# Patient Record
Sex: Male | Born: 1980 | Hispanic: Yes | Marital: Married | Smoking: Never smoker
Health system: Southern US, Community
[De-identification: ages and names within clinical notes are randomized; demographics above are authoritative.]

## PROBLEM LIST (undated history)

## (undated) DIAGNOSIS — F419 Anxiety disorder, unspecified: Secondary | ICD-10-CM

## (undated) DIAGNOSIS — F32A Depression, unspecified: Secondary | ICD-10-CM

## (undated) HISTORY — PX: VASECTOMY: SHX75

---

## 2007-04-30 ENCOUNTER — Emergency Department: Payer: Self-pay | Admitting: Emergency Medicine

## 2019-06-22 ENCOUNTER — Ambulatory Visit: Payer: Self-pay | Attending: Internal Medicine

## 2019-06-22 ENCOUNTER — Ambulatory Visit: Payer: Self-pay

## 2019-06-22 DIAGNOSIS — Z23 Encounter for immunization: Secondary | ICD-10-CM

## 2019-06-22 NOTE — Progress Notes (Signed)
   Covid-19 Vaccination Clinic  Name:  Bion Todorov    MRN: [id number] DOB: Dec 15, 1980  06/22/2019  Mr. Sandon Yoho was observed post Covid-19 immunization for 15 minutes without incident. He was provided with Vaccine Information Sheet and instruction to access the V-Safe system.   Mr. Siddharth Babington was instructed to call 911 with any severe reactions post vaccine: Marland Kitchen Difficulty breathing  . Swelling of face and throat  . A fast heartbeat  . A bad rash all over body  . Dizziness and weakness   Immunizations Administered    Name Date Dose VIS Date Route   Pfizer COVID-19 Vaccine 06/22/2019  1:59 PM 0.3 mL 02/22/2019 Intramuscular   Manufacturer: [employer], [employer]   Lot: [id number]   NDC: [address]-2

## 2019-07-16 ENCOUNTER — Ambulatory Visit: Payer: Self-pay | Attending: Internal Medicine

## 2019-07-16 DIAGNOSIS — Z23 Encounter for immunization: Secondary | ICD-10-CM

## 2019-07-20 ENCOUNTER — Ambulatory Visit: Payer: Self-pay

## 2020-08-14 ENCOUNTER — Encounter: Payer: Self-pay | Admitting: Urology

## 2020-08-14 ENCOUNTER — Other Ambulatory Visit: Payer: Self-pay

## 2020-08-14 ENCOUNTER — Ambulatory Visit (INDEPENDENT_AMBULATORY_CARE_PROVIDER_SITE_OTHER): Payer: BC Managed Care – PPO | Admitting: Urology

## 2020-08-14 VITALS — BP 107/68 | HR 62 | Ht 71.0 in | Wt 178.0 lb

## 2020-08-14 DIAGNOSIS — Z3009 Encounter for other general counseling and advice on contraception: Secondary | ICD-10-CM

## 2020-08-14 NOTE — Patient Instructions (Signed)

## 2020-08-14 NOTE — Progress Notes (Signed)
08/14/2020 4:54 PM   [name] [birth year] [id number]  Referring provider: Wilford Corner, PA-C [address] [place],  [place] [address]  Chief Complaint  Patient presents with  . VAS Consult    HPI: Daryl Chandler is a 40y.o. male who presents for vasectomy counseling.  Marland Kitchen He and his fiance have 4 children and do not desire additional children . Denies prior history urologic problems including chronic scrotal pain, epididymitis or orchitis . No previous history inguinal/pelvic hernia . No history of bleeding or clotting disorders   PMH: No past medical history on file.  Surgical History: None  Home Medications:  Allergies as of 08/14/2020   No Known Allergies     Medication List    as of August 14, 2020  4:54 PM   You have not been prescribed any medications.     Allergies: No Known Allergies  Family History: No family history on file.  Social History:  reports that he has never smoked. He has never used smokeless tobacco. He reports current alcohol use. No history on file for drug use.   Physical Exam: BP 107/68   Pulse 62   Ht 5\' 11"  (1.803 m)   Wt 178 lb (80.7 kg)   BMI 24.83 kg/m   Constitutional:  Alert and oriented, No acute distress. HEENT: [place] AT, moist mucus membranes.  Trachea midline, no masses. Cardiovascular: No clubbing, cyanosis, or edema. Respiratory: Normal respiratory effort, no increased work of breathing. GI: Abdomen is soft, nontender, nondistended, no abdominal masses GU: Phallus without lesions, testes descended bilaterally without masses or tenderness, spermatic cord/epididymis palpably normal bilaterally.  Vasa palpable bilaterally Skin: No rashes, bruises or suspicious lesions. Neurologic: Grossly intact, no focal deficits, moving all 4 extremities. Psychiatric: Normal mood and affect.   Assessment & Plan:    1.  Undesired fertility . Desires to schedule vasectomy . We had a long discussion about  vasectomy. We specifically discussed the procedure, recovery and the risks, benefits and alternatives of vasectomy. I explained that the procedure entails removal of a segment of each vas deferens, each of which conducts sperm, and that the purpose of this procedure is to cause sterility (inability to produce children or cause pregnancy). Vasectomy is intended to be permanent and irreversible form of contraception. Options for fertility after vasectomy include vasectomy reversal, or sperm retrieval with in vitro fertilization. These options are not always successful, and they may be expensive. We discussed reversible forms of birth control such as condoms, IUD or diaphragms, as well as the option of freezing sperm in a sperm bank prior to the vasectomy procedure. We discussed the importance of avoiding strenuous exercise for four days after vasectomy, and the importance of refraining from any form of ejaculation for seven days after vasectomy. I explained that vasectomy does not produce immediate sterility so another form of contraceptive must be used until sterility is assured by having semen checked for sperm. Thus, a post vasectomy semen analysis is necessary to confirm sterility. Rarely, vasectomy must be repeated. We discussed the approximately 1 in 2,000 risk of pregnancy after vasectomy for men who have post-vasectomy semen analysis showing absent sperm or rare non-motile sperm. Typical side effects include a small amount of oozing blood, some discomfort and mild swelling in the area of incision.  Vasectomy does not affect sexual performance, function, please, sensation, interest, desire, satisfaction, penile erection, volume of semen or ejaculation. Other rare risks include allergy or adverse reaction to an anesthetic, testicular atrophy, hematoma,  infection/abscess, prolonged tenderness of the vas deferens, pain, swelling, painful nodule or scar (called sperm granuloma) or epididymtis. We discussed chronic  testicular pain syndrome. This has been reported to occur in as many as 1-2% of men and may be permanent. This can be treated with medication, small procedures or (rarely) surgery. . Valium 10 mg as a preprocedure anxiolytic sent to pharmacy and he was informed he would need a driver if utilizing this medication.    Riki Altes, MD  [facility] [address] [place], [place] [address] [phone]

## 2020-08-16 MED ORDER — DIAZEPAM 10 MG PO TABS: ORAL_TABLET | ORAL | 0 refills | Status: DC

## 2020-09-03 ENCOUNTER — Encounter: Payer: Self-pay | Admitting: Urology

## 2020-09-03 ENCOUNTER — Other Ambulatory Visit: Payer: Self-pay

## 2020-09-03 ENCOUNTER — Ambulatory Visit: Payer: BC Managed Care – PPO | Admitting: Urology

## 2020-09-03 VITALS — BP 133/71 | HR 66 | Ht 71.0 in | Wt 180.0 lb

## 2020-09-03 DIAGNOSIS — Z302 Encounter for sterilization: Secondary | ICD-10-CM

## 2020-09-03 MED ORDER — HYDROCODONE-ACETAMINOPHEN 5-325 MG PO TABS: 1.0000 | ORAL_TABLET | ORAL | 0 refills | Status: DC | PRN

## 2020-09-03 NOTE — Progress Notes (Signed)
Vasectomy Procedure Note  Indications: The patient is a 40 y.o. male who presents today for elective sterilization.  He has been consented for the procedure.  He is aware of the risks and benefits.  He had no additional questions.  He agrees to proceed.  He denies any other significant change since his last visit.  Pre-operative Diagnosis: Elective sterilization  Post-operative Diagnosis: Elective sterilization  Premedication: Valium 10 mg po  Surgeon: Lorin Picket C. Syreeta Figler, M.D  Description: The patient was prepped and draped in the standard fashion.  The right vas deferens was identified and brought superiorly to the anterior scrotal skin.  The skin and vas was then anesthetized utilizing 7 ml 1% lidocaine.  A small stab incision was made and spread with the vas dissector.  The vas was grasped utilizing the vas clamp and elevated out of the incision.  The vas was dissected free from surrounding tissue and vessels and an ~1 cm segment was excised.  The vas lumens were cauterized utilizing electrocautery.  The distal segment was buried in the surrounding sheath with a 3-0 chromic suture.  No significant bleeding was observed.  The vas ends were then dropped back into the hemiscrotum.  The skin was closed with hemostatic pressure.  An identical procedure was performed on the contralateral side.  Clean dry gauze was applied to the incision sites.  The patient tolerated the procedure well.  Complications:None  Recommendations: 1.  No lifting greater than 10 pounds or strenuousactivity for 1 week. 2.  Scrotal support for 1 week. 3.  Shower only for 1 week; may shower in the morning 4.  May resume intercourse in one week if no significant discomfort.  Continue alternate contraception for 12 weeks.  5.  Call for significant pain, swelling, redness, drainage or fever greater than 100.5. 6.  Rx hydrocodone/APAP 5/325 1-2 every 6 hours as needed for pain. 7.  Follow-up semen analysis in 12  weeks.   Irineo Axon, MD

## 2020-09-03 NOTE — Patient Instructions (Signed)

## 2020-09-27 ENCOUNTER — Emergency Department: Payer: BC Managed Care – PPO

## 2020-09-27 ENCOUNTER — Emergency Department
Admission: EM | Admit: 2020-09-27 | Discharge: 2020-09-27 | Disposition: A | Discharge status: Home / Self Care | Payer: BC Managed Care – PPO | Attending: Emergency Medicine | Admitting: Emergency Medicine

## 2020-09-27 ENCOUNTER — Other Ambulatory Visit: Payer: Self-pay

## 2020-09-27 ENCOUNTER — Encounter: Payer: Self-pay | Admitting: Emergency Medicine

## 2020-09-27 DIAGNOSIS — K0889 Other specified disorders of teeth and supporting structures: Secondary | ICD-10-CM

## 2020-09-27 DIAGNOSIS — Z79899 Other long term (current) drug therapy: Secondary | ICD-10-CM | POA: Insufficient documentation

## 2020-09-27 DIAGNOSIS — R1032 Left lower quadrant pain: Secondary | ICD-10-CM | POA: Diagnosis not present

## 2020-09-27 DIAGNOSIS — N5082 Scrotal pain: Secondary | ICD-10-CM

## 2020-09-27 DIAGNOSIS — N50812 Left testicular pain: Secondary | ICD-10-CM

## 2020-09-27 LAB — COMPREHENSIVE METABOLIC PANEL
ALT: 24 U/L (ref 0–44)
AST: 20 U/L (ref 15–41)
Albumin: 4.1 g/dL (ref 3.5–5.0)
Alkaline Phosphatase: 70 U/L (ref 38–126)
Anion gap: 4 — ABNORMAL LOW (ref 5–15)
BUN: 15 mg/dL (ref 6–20)
CO2: 25 mmol/L (ref 22–32)
Calcium: 8.6 mg/dL — ABNORMAL LOW (ref 8.9–10.3)
Chloride: 106 mmol/L (ref 98–111)
Creatinine, Ser: 0.82 mg/dL (ref 0.61–1.24)
GFR, Estimated: >60 mL/min (ref >60)
Glucose, Bld: 89 mg/dL (ref 70–99)
Potassium: 4 mmol/L (ref 3.5–5.1)
Sodium: 135 mmol/L (ref 135–145)
Total Bilirubin: 1.3 mg/dL — ABNORMAL HIGH (ref 0.3–1.2)
Total Protein: 6.5 g/dL (ref 6.5–8.1)

## 2020-09-27 LAB — CBC WITH DIFFERENTIAL/PLATELET
Abs Immature Granulocytes: 0.02 10*3/uL (ref 0.00–0.07)
Basophils Absolute: 0 10*3/uL (ref 0.0–0.1)
Basophils Relative: 0 %
Eosinophils Absolute: 0.1 10*3/uL (ref 0.0–0.5)
Eosinophils Relative: 1 %
HCT: 38.4 % — ABNORMAL LOW (ref 39.0–52.0)
Hemoglobin: 13.2 g/dL (ref 13.0–17.0)
Immature Granulocytes: 0 %
Lymphocytes Relative: 26 %
Lymphs Abs: 1.6 10*3/uL (ref 0.7–4.0)
MCH: 29.4 pg (ref 26.0–34.0)
MCHC: 34.4 g/dL (ref 30.0–36.0)
MCV: 85.5 fL (ref 80.0–100.0)
Monocytes Absolute: 0.7 10*3/uL (ref 0.1–1.0)
Monocytes Relative: 11 %
Neutro Abs: 3.9 10*3/uL (ref 1.7–7.7)
Neutrophils Relative %: 62 %
Platelets: 209 10*3/uL (ref 150–400)
RBC: 4.49 MIL/uL (ref 4.22–5.81)
RDW: 11.8 % (ref 11.5–15.5)
WBC: 6.3 10*3/uL (ref 4.0–10.5)
nRBC: 0 % (ref 0.0–0.2)

## 2020-09-27 LAB — URINALYSIS, COMPLETE (UACMP) WITH MICROSCOPIC
Bacteria, UA: NONE SEEN
Bilirubin Urine: NEGATIVE
Glucose, UA: NEGATIVE mg/dL
Hgb urine dipstick: NEGATIVE
Ketones, ur: 5 mg/dL — AB
Leukocytes,Ua: NEGATIVE
Nitrite: NEGATIVE
Protein, ur: NEGATIVE mg/dL
Specific Gravity, Urine: 1.016 (ref 1.005–1.030)
WBC, UA: NONE SEEN WBC/hpf (ref 0–5)
pH: 6 (ref 5.0–8.0)

## 2020-09-27 MED ORDER — ONDANSETRON HCL 4 MG/2ML IJ SOLN
4.0000 mg | Freq: Once | INTRAMUSCULAR | Status: AC
  Administered 2020-09-27: 4 mg via INTRAVENOUS
  Filled 2020-09-27: qty 2

## 2020-09-27 MED ORDER — IOHEXOL 350 MG/ML SOLN: 100.0000 mL | Freq: Once | INTRAVENOUS | Status: DC | PRN

## 2020-09-27 MED ORDER — IOHEXOL 350 MG/ML SOLN
100.0000 mL | Freq: Once | INTRAVENOUS | Status: AC | PRN
  Administered 2020-09-27: 100 mL via INTRAVENOUS

## 2020-09-27 MED ORDER — PENICILLIN V POTASSIUM 250 MG PO TABS
500.0000 mg | ORAL_TABLET | Freq: Once | ORAL | Status: AC
  Administered 2020-09-27: 500 mg via ORAL
  Filled 2020-09-27: qty 2

## 2020-09-27 MED ORDER — HYDROCODONE-ACETAMINOPHEN 5-325 MG PO TABS: 1.0000 | ORAL_TABLET | ORAL | 0 refills | Status: DC | PRN

## 2020-09-27 MED ORDER — MORPHINE SULFATE (PF) 4 MG/ML IV SOLN
4.0000 mg | Freq: Once | INTRAVENOUS | Status: AC
  Administered 2020-09-27: 4 mg via INTRAVENOUS
  Filled 2020-09-27: qty 1

## 2020-09-27 MED ORDER — PENICILLIN V POTASSIUM 500 MG PO TABS: 500.0000 mg | ORAL_TABLET | Freq: Four times a day (QID) | ORAL | 0 refills | Status: AC

## 2020-09-27 NOTE — ED Provider Notes (Addendum)
[facility] Emergency Department Provider Note   ____________________________________________   Event Date/Time   First MD Initiated Contact with Patient 09/27/20 1044     (approximate)  I have reviewed the triage vital signs and the nursing notes.   HISTORY  Chief Complaint Testicle Pain and Dental Pain    HPI Daryl Chandler is a 40 y.o. male patient reports dental pain on the left lower side in the back of the jaw.  Is been going on for day or 2 worse last night with a lot of swelling.  Swelling seemed to get better after he took some Motrin.  He also complains of left testicular pain.  He had a vasectomy 3 to 4 weeks ago and was doing well until began hurting last night he got up to get some Tylenol and felt a strong pain there the pain and also in the left lower quadrant of his abdomen to palpation also to percussion of the left lower quadrant of the abdomen.  The lump is in the area where the vas would be.  It is too tender for me to get a really good exam.  Both pains are moderately severe and achy.        History reviewed. No pertinent past medical history.  There are no problems to display for this patient.   Past Surgical History:  Procedure Laterality Date   VASECTOMY      Prior to Admission medications   Medication Sig Start Date End Date Taking? Authorizing Provider  HYDROcodone-acetaminophen (NORCO) 5-325 MG tablet Take 1 tablet by mouth every 4 (four) hours as needed for moderate pain. 09/27/20  Yes Arnaldo Natal, MD  penicillin v potassium (VEETID) 500 MG tablet Take 1 tablet (500 mg total) by mouth 4 (four) times daily. 09/27/20  Yes Arnaldo Natal, MD  diazepam (VALIUM) 10 MG tablet 1 tab po 30 min prior to procedure 08/16/20   Stoioff, Verna Czech, MD  HYDROcodone-acetaminophen (NORCO/VICODIN) 5-325 MG tablet Take 1 tablet by mouth every 4 (four) hours as needed for moderate pain. 09/03/20   Riki Altes, MD    Allergies Patient  has no known allergies.  History reviewed. No pertinent family history.  Social History Social History   Tobacco Use   Smoking status: Never   Smokeless tobacco: Never  Substance Use Topics   Alcohol use: Yes     Constitutional: No fever/chills Eyes: No visual changes. ENT: No sore throat. Cardiovascular: Denies chest pain. Respiratory: Denies shortness of breath. Gastrointestinal: No abdominal pain.  No nausea, no vomiting.  No diarrhea.  No constipation. Genitourinary: Negative for dysuria. Musculoskeletal: Negative for back pain. Skin: Negative for rash. Neurological: Negative for headaches, focal weakness   ____________________________________________   PHYSICAL EXAM:  VITAL SIGNS: ED Triage Vitals  Enc Vitals Group     BP 09/27/20 0945 123/78     Pulse Rate 09/27/20 0945 60     Resp 09/27/20 0945 18     Temp 09/27/20 0945 98.2 F (36.8 C)     Temp Source 09/27/20 0945 Oral     SpO2 09/27/20 0945 98 %     Weight 09/27/20 0946 179 lb 14.3 oz (81.6 kg)     Height 09/27/20 0946 5\' 11"  (1.803 m)     Head Circumference --      Peak Flow --      Pain Score 09/27/20 0945 4     Pain Loc --      Pain  Edu? --      Excl. in GC? --    Constitutional: Alert and oriented. Well appearing and in no acute distress. Eyes: Conjunctivae are normal.  Head: Atraumatic. Nose: No congestion/rhinnorhea. Mouth/Throat: Mucous membranes are moist.  Oropharynx non-erythematous.  There is tenderness on percussion of the rear most molar on the left and the gum immediately behind it toward the jaw joint is somewhat swollen and appears to be a very small ulcer on the area.  He may have been biting on that and then got infected possibly. Neck: No stridor.   Cardiovascular: Normal rate, regular rhythm. Grossly normal heart sounds.  Good peripheral circulation. Respiratory: Normal respiratory effort.  No retractions. Lungs CTAB. Gastrointestinal: Soft and nontender except for the left lower  quadrant as described to palpation and percussion. No distention. No abdominal bruits. No CVA tenderness. Genitourinary: There is no bruising or swelling on the scrotum but there is the fullness and sensation of the small about 1 cm possible lump in the mid vas deferens area on the left side. Musculoskeletal: No lower extremity tenderness nor edema.  Neurologic:  Normal speech and language. No gross focal neurologic deficits are appreciated.  Skin:  Skin is warm, dry and intact. No rash noted.   ____________________________________________   LABS (all labs ordered are listed, but only abnormal results are displayed)  Labs Reviewed  CBC WITH DIFFERENTIAL/PLATELET - Abnormal; Notable for the following components:      Result Value   HCT 38.4 (*)    All other components within normal limits  COMPREHENSIVE METABOLIC PANEL - Abnormal; Notable for the following components:   Calcium 8.6 (*)    Total Bilirubin 1.3 (*)    Anion gap 4 (*)    All other components within normal limits  URINALYSIS, COMPLETE (UACMP) WITH MICROSCOPIC - Abnormal; Notable for the following components:   Color, Urine YELLOW (*)    APPearance CLEAR (*)    Ketones, ur 5 (*)    All other components within normal limits   ____________________________________________  EKG   ____________________________________________  RADIOLOGY Jill Poling, personally viewed and evaluated these images (plain radiographs) as part of my medical decision making, as well as reviewing the written report by the radiologist.  ED MD interpretation: Ultrasound of scrotum read by radiology reviewed by me does not show any acute pathology there is a small bilateral hydrocele. CT read by radiology reviewed by me does not show any acute pathology either.  Official radiology report(s): CT ABDOMEN PELVIS W CONTRAST  Result Date: 09/27/2020 CLINICAL DATA:  Left lower quadrant pain EXAM: CT ABDOMEN AND PELVIS WITH CONTRAST TECHNIQUE:  Multidetector CT imaging of the abdomen and pelvis was performed using the standard protocol following bolus administration of intravenous contrast. CONTRAST:  OMNIPAQUE IOHEXOL 350 MG/ML SOLN COMPARISON:  04/30/2007 FINDINGS: Lower chest: No acute abnormality. Hepatobiliary: No focal liver abnormality is seen. No gallstones, gallbladder wall thickening, or biliary dilatation. Pancreas: Unremarkable. No pancreatic ductal dilatation or surrounding inflammatory changes. Spleen: Normal in size without focal abnormality. Adrenals/Urinary Tract: Adrenal glands are unremarkable. Kidneys are normal, without renal calculi, focal lesion, or hydronephrosis. Bladder is physiologically distended. Stomach/Bowel: Stomach incompletely distended, unremarkable. Small bowel is decompressed, with incomplete distal passage of the oral contrast material. Normal appendix. The colon is nondilated, unremarkable. Vascular/Lymphatic: No significant vascular findings are present. No enlarged abdominal or pelvic lymph nodes. Reproductive: Prostate is unremarkable. Other: No ascites.  No free air. Musculoskeletal: Small protrusion L5-S1. No fracture or  worrisome bone lesion. IMPRESSION: No acute findings. Electronically Signed   By: Corlis Leak M.D.   On: 09/27/2020 15:08   US SCROTUM W/DOPPLER  Result Date: 09/27/2020 CLINICAL DATA:  Left testicular pain. EXAM: SCROTAL ULTRASOUND DOPPLER ULTRASOUND OF THE TESTICLES TECHNIQUE: Complete ultrasound examination of the testicles, epididymis, and other scrotal structures was performed. Color and spectral Doppler ultrasound were also utilized to evaluate blood flow to the testicles. COMPARISON:  None. FINDINGS: Right testicle Measurements: 4.2 x 2.1 x 2.8 cm. No mass or microlithiasis visualized. Left testicle Measurements: 3.7 x 2.4 x 2.9 cm. No mass or microlithiasis visualized. Right epididymis:  Normal in size and appearance. Left epididymis:  Normal in size and appearance. Hydrocele:   Small bilateral hydrocele. Varicocele:  None visualized. Pulsed Doppler interrogation of both testes demonstrates normal low resistance arterial and venous waveforms bilaterally. IMPRESSION: Normal appearance of the testicles. Small bilateral hydrocele. Electronically Signed   By: Ted Mcalpine M.D.   On: 09/27/2020 11:26    ____________________________________________   PROCEDURES  Procedure(s) performed (including Critical Care):  Procedures   ____________________________________________   INITIAL IMPRESSION / ASSESSMENT AND PLAN / ED COURSE  Patient is having fairly severe left lower quadrant pain that is markedly worse with palpation and percussion.  I want to make sure he is not having any diverticulitis or abscess that may come from an infection tracking up after surgery or possibly even a kidney stone or something similar.  ----------------------------------------- 3:20 PM on 09/27/2020 ----------------------------------------- Patient reports pain medicine helped a lot.  I am now able to palpate a small pea-sized smooth firm lump near the vas.  I am quite sure if it is on the vas because it still somewhat tender. Discussed the patient with Dr. Gabrielle Dare who will contact Dr. Lonna Cobb to arrange follow-up.  He feels Motrin or Tylenol with ice and elevation and tight underwear should be enough.  This may be a small sperm granuloma or scar tissue.  Should not result in any major problems.  Since the patient was still very tender when he got here I will give him 2-3 hydrocodone if he needs them.   Additionally I will give him Pen-Vee K 500 mg 1 4 times a day for his tooth ache.  He can use the Tylenol or Motrin or hydrocodone as needed for them as well.  He has said he will follow-up with his dentist soon as possible.  He will also return here if it gets worse.      ____________________________________________   FINAL CLINICAL IMPRESSION(S) / ED DIAGNOSES  Final diagnoses:   Scrotal pain  Toothache     ED Discharge Orders          Ordered    HYDROcodone-acetaminophen (NORCO) 5-325 MG tablet  Every 4 hours PRN        09/27/20 1527    penicillin v potassium (VEETID) 500 MG tablet  4 times daily        09/27/20 1534             Note:  This document was prepared using Dragon voice recognition software and may include unintentional dictation errors.    Arnaldo Natal, MD 09/27/20 1526    Arnaldo Natal, MD 09/27/20 1536

## 2020-09-27 NOTE — ED Notes (Signed)
Pt has finished oral contrast, called CT to come get pt.

## 2020-09-27 NOTE — Discharge Instructions (Addendum)
Please ice and elevate the scrotum.  Do this 20 minutes an hour with a towel between you and the ice as tolerated.  You can use Motrin 4 of the over-the-counter pills 3 times a day with food or Tylenol for pain.  Use the tight supportive underwear like we talked about.  For severe pain I will give you some Vicodin 1 pill 4 times a day as needed.  I will not give you many of those.  Do not drive on them as the police will consider you an impaired driver.  Do not operate hazardous machinery either.  Please return for increasing pain fever or any other problems.  Call your doctor and schedule an appointment for follow-up.  Hopefully they will be able to see you tomorrow.  I discussed your case with Dr.Sninsky who will also forward your information to Dr. Lonna Cobb for follow-up.  Use the penicillin 1 pill 4 times a day to help treat the dental infection.  Follow-up with your dentist as planned.

## 2020-09-27 NOTE — ED Triage Notes (Signed)
Pt comes into the ED via POV c/o dental pain on the left lower side as well as testicular pain (left).  Pt states that he did have a vasectomy a couple weeks ago and has had no problems until last night when he got up to get tylenol and he felt a strong pain and now there is a lump present.  Pt states there est of the testicle is not swollen, but just uncomfortable.  Pt ambulatory to triage at this time.

## 2020-09-27 NOTE — ED Notes (Signed)
Pt in CT at this time.

## 2020-09-28 ENCOUNTER — Telehealth: Payer: Self-pay

## 2020-09-28 NOTE — Telephone Encounter (Signed)
Pt Daryl Chandler on admin voicemail requesting a call back, called pt back he states that he felt 2 lumps in the scrotum and "freaked out" he then went to ER. He also states that he was having severe scrotal pain at that time. He cannot recall if he noticed the lumps before or after resuming intercourse. He was given pain medication in the ER and states that pain is significantly better now. He has also been icing and wearing scrotal support. He states he would like a follow up to ensure there are no issues, pt offered next available however he states he cannot come in until Friday. Patient schedule accordingly and advised to call back for questions or concerns or should symptoms worsen.

## 2020-10-02 ENCOUNTER — Ambulatory Visit (INDEPENDENT_AMBULATORY_CARE_PROVIDER_SITE_OTHER): Payer: BC Managed Care – PPO | Admitting: Physician Assistant

## 2020-10-02 ENCOUNTER — Encounter: Payer: Self-pay | Admitting: Physician Assistant

## 2020-10-02 ENCOUNTER — Other Ambulatory Visit: Payer: Self-pay

## 2020-10-02 VITALS — BP 119/72 | HR 60 | Ht 72.0 in | Wt 179.0 lb

## 2020-10-02 DIAGNOSIS — N5089 Other specified disorders of the male genital organs: Secondary | ICD-10-CM

## 2020-10-02 NOTE — Patient Instructions (Signed)
Swelling in the scrotum may take some time to resolve, as the scrotum is located below your torso and fluid has to work against gravity to leave this tissue. To promote resolution of swelling and decrease discomfort, please follow the scrotal support instructions below.  Scrotal support instructions:  Wear compressive underwear, e.g. jockstrap or snug boxer briefs, to keep the scrotum supported and promote drainage of swelling. Elevate the scrotum when at rest. Tuck a rolled washcloth or towel underneath the scrotum to lift it up and promote drainage back into your abdomen. Use ice as needed for pain relief. Never apply ice to the scrotum for longer than 20 minutes at a time and always keep a layer of fabric between the ice and the skin.  

## 2020-10-02 NOTE — Progress Notes (Signed)
   10/02/2020 3:37 PM   Daryl Chandler 14-Feb-1981 [id number]  CC: Chief Complaint  Patient presents with   Testicle Pain   HPI: Daryl Chandler is a 40 y.o. male who underwent vasectomy with Dr. Lonna Cobb on 09/03/2020 who presents today for evaluation of a left scrotal nodule.   He was seen in the emergency department 5 days ago for evaluation of left scrotal pain associated with the nodule as above as well as tooth ache.  He was administered Norco and penicillin V and discharged when his pain significantly improved.  Today he reports his severe pain started after a day of intense physical labor when he was not wearing supportive underwear.  He has been taking ibuprofen and Tylenol around-the-clock since being seen in the emergency department as well as using ice and wearing supportive underwear and is feeling significantly better.  He thinks that the nodule has shrunk in size over the course of this time.  PMH: No past medical history on file.  Surgical History: Past Surgical History:  Procedure Laterality Date   VASECTOMY      Home Medications:  Allergies as of 10/02/2020   No Known Allergies      Medication List        Accurate as of October 02, 2020  3:37 PM. If you have any questions, ask your nurse or doctor.          STOP taking these medications    HYDROcodone-acetaminophen 5-325 MG tablet Commonly known as: Norco Stopped by: Carman Ching, PA-C       TAKE these medications    diazepam 10 MG tablet Commonly known as: VALIUM 1 tab po 30 min prior to procedure   penicillin v potassium 500 MG tablet Commonly known as: VEETID Take 1 tablet (500 mg total) by mouth 4 (four) times daily.        Allergies:  No Known Allergies  Family History: No family history on file.  Social History:   reports that he has never smoked. He has never used smokeless tobacco. He reports current alcohol use. No history on file for drug use.  Physical  Exam: BP 119/72   Pulse (!) 0   Ht 6' (1.829 m)   Wt 179 lb (81.2 kg)   BMI 24.28 kg/m   Constitutional:  Alert and oriented, no acute distress, nontoxic appearing HEENT: [place], AT Cardiovascular: No clubbing, cyanosis, or edema Respiratory: Normal respiratory effort, no increased work of breathing GU: Pea-sized nodule within the left hemiscrotum superior to the testis, mildly tender with palpation.  No fluctuance or purulence. 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  Counseled him to continue scrotal support and explained that this should resolve on its own over time.  He expressed understanding  Return if symptoms worsen or fail to improve.  Carman Ching, PA-C  [facility] [address], [address] [place], [place] [address] [phone]

## 2020-11-02 ENCOUNTER — Other Ambulatory Visit: Payer: Self-pay

## 2020-11-02 DIAGNOSIS — Z302 Encounter for sterilization: Secondary | ICD-10-CM

## 2020-12-07 ENCOUNTER — Other Ambulatory Visit: Payer: Self-pay

## 2020-12-07 ENCOUNTER — Other Ambulatory Visit: Payer: BC Managed Care – PPO

## 2020-12-07 DIAGNOSIS — Z302 Encounter for sterilization: Secondary | ICD-10-CM

## 2020-12-08 ENCOUNTER — Telehealth: Payer: Self-pay | Admitting: *Deleted

## 2020-12-08 LAB — POST-VAS SPERM EVALUATION,QUAL: Volume: 1.8 mL

## 2020-12-08 NOTE — Telephone Encounter (Signed)
-----   Message from Riki Altes, MD sent at 12/08/2020  4:05 PM EDT ----- Semen analysis showed no sperm present.  Okay to use vasectomy as primary contraception.

## 2020-12-08 NOTE — Telephone Encounter (Signed)
Notified patient as instructed, patient pleased. Discussed follow-up appointments, patient agrees  

## 2021-12-27 ENCOUNTER — Ambulatory Visit (HOSPITAL_COMMUNITY): Payer: BC Managed Care – PPO | Admitting: Psychiatry

## 2021-12-27 ENCOUNTER — Encounter (HOSPITAL_COMMUNITY): Payer: Self-pay | Admitting: Psychiatry

## 2021-12-27 VITALS — BP 105/75 | HR 70 | Temp 98.3°F | Ht 71.0 in | Wt 179.0 lb

## 2021-12-27 DIAGNOSIS — F4310 Post-traumatic stress disorder, unspecified: Secondary | ICD-10-CM

## 2021-12-27 DIAGNOSIS — F411 Generalized anxiety disorder: Secondary | ICD-10-CM | POA: Diagnosis not present

## 2021-12-27 MED ORDER — DIVALPROEX SODIUM ER 500 MG PO TB24: 500.0000 mg | ORAL_TABLET | Freq: Every day | ORAL | 1 refills | Status: DC

## 2021-12-27 MED ORDER — HYDROXYZINE HCL 25 MG PO TABS: 25.0000 mg | ORAL_TABLET | Freq: Three times a day (TID) | ORAL | 1 refills | Status: DC | PRN
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
## 2022-01-31 ENCOUNTER — Ambulatory Visit (HOSPITAL_COMMUNITY): Payer: BC Managed Care – PPO | Admitting: Psychiatry

## 2022-01-31 ENCOUNTER — Encounter (HOSPITAL_COMMUNITY): Payer: Self-pay | Admitting: Psychiatry

## 2022-01-31 DIAGNOSIS — F4310 Post-traumatic stress disorder, unspecified: Secondary | ICD-10-CM | POA: Diagnosis not present

## 2022-01-31 DIAGNOSIS — F411 Generalized anxiety disorder: Secondary | ICD-10-CM | POA: Diagnosis not present

## 2022-01-31 MED ORDER — DIVALPROEX SODIUM ER 500 MG PO TB24: 500.0000 mg | ORAL_TABLET | Freq: Every day | ORAL | 2 refills | Status: DC

## 2022-01-31 MED ORDER — ESCITALOPRAM OXALATE 20 MG PO TABS: 20.0000 mg | ORAL_TABLET | Freq: Every day | ORAL | 1 refills | Status: DC
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
## 2022-03-01 ENCOUNTER — Telehealth (HOSPITAL_COMMUNITY): Payer: BC Managed Care – PPO | Admitting: Psychiatry

## 2022-03-02 ENCOUNTER — Telehealth (HOSPITAL_COMMUNITY): Payer: BC Managed Care – PPO | Admitting: Psychiatry

## 2022-04-19 ENCOUNTER — Encounter (HOSPITAL_COMMUNITY): Payer: BC Managed Care – PPO | Admitting: Psychiatry

## 2022-04-19 ENCOUNTER — Encounter (HOSPITAL_COMMUNITY): Payer: Self-pay

## 2022-04-19 NOTE — Progress Notes (Signed)
This encounter was created in error - please disregard.

## 2022-06-02 ENCOUNTER — Other Ambulatory Visit (HOSPITAL_COMMUNITY): Payer: Self-pay | Admitting: Psychiatry

## 2022-06-02 DIAGNOSIS — F4310 Post-traumatic stress disorder, unspecified: Secondary | ICD-10-CM

## 2022-06-02 DIAGNOSIS — F411 Generalized anxiety disorder: Secondary | ICD-10-CM

## 2022-06-02 NOTE — Telephone Encounter (Signed)
30 day script sent, needs an appointment for further refills

## 2022-06-24 ENCOUNTER — Other Ambulatory Visit (HOSPITAL_COMMUNITY): Payer: Self-pay | Admitting: Psychiatry

## 2022-06-24 DIAGNOSIS — F4310 Post-traumatic stress disorder, unspecified: Secondary | ICD-10-CM

## 2022-06-24 DIAGNOSIS — F411 Generalized anxiety disorder: Secondary | ICD-10-CM

## 2022-06-30 ENCOUNTER — Other Ambulatory Visit (HOSPITAL_COMMUNITY): Payer: Self-pay | Admitting: Psychiatry

## 2022-06-30 DIAGNOSIS — F4310 Post-traumatic stress disorder, unspecified: Secondary | ICD-10-CM

## 2022-06-30 DIAGNOSIS — F411 Generalized anxiety disorder: Secondary | ICD-10-CM

## 2022-07-01 ENCOUNTER — Telehealth (HOSPITAL_COMMUNITY): Payer: Self-pay | Admitting: *Deleted

## 2022-07-01 NOTE — Telephone Encounter (Signed)
Pt LVM requesting refills on all meds. Writer returned call but had to leave pt a VM advising that pt will need to make an appointment, as he has not been seen since 02/01/23, No Showed last appointment 04/19/22 and cx previous two appointments. Pt encouraged to call appointment line @ [phone] prior to 1200 as we close at 1200 on Fridays. Pt advised we would then review sending bridge scripts.

## 2022-07-04 ENCOUNTER — Telehealth (HOSPITAL_COMMUNITY): Payer: Self-pay | Admitting: *Deleted

## 2022-07-04 DIAGNOSIS — F4310 Post-traumatic stress disorder, unspecified: Secondary | ICD-10-CM

## 2022-07-04 DIAGNOSIS — F411 Generalized anxiety disorder: Secondary | ICD-10-CM

## 2022-07-04 MED ORDER — DIVALPROEX SODIUM ER 500 MG PO TB24: 500.0000 mg | ORAL_TABLET | Freq: Every day | ORAL | 0 refills | Status: DC

## 2022-07-04 MED ORDER — HYDROXYZINE HCL 25 MG PO TABS: 25.0000 mg | ORAL_TABLET | Freq: Three times a day (TID) | ORAL | 0 refills | Status: DC | PRN

## 2022-07-04 NOTE — Telephone Encounter (Signed)
[facility] Rx's for Depakote  ER 500 mg Qd and Hydroxyzine 25 mg tabs sent to [facility] [address] [place]. Pt should have enough Lexapro 20 mg QD until appointment. Pt has an appointment scheduled for 07/18/22.

## 2022-07-18 ENCOUNTER — Encounter (HOSPITAL_COMMUNITY): Payer: Self-pay | Admitting: Psychiatry

## 2022-07-18 ENCOUNTER — Ambulatory Visit (HOSPITAL_BASED_OUTPATIENT_CLINIC_OR_DEPARTMENT_OTHER): Payer: Self-pay | Admitting: Psychiatry

## 2022-07-18 DIAGNOSIS — F411 Generalized anxiety disorder: Secondary | ICD-10-CM

## 2022-07-18 DIAGNOSIS — F4310 Post-traumatic stress disorder, unspecified: Secondary | ICD-10-CM

## 2022-07-18 MED ORDER — ESCITALOPRAM OXALATE 20 MG PO TABS: 20.0000 mg | ORAL_TABLET | Freq: Every day | ORAL | 0 refills | Status: DC

## 2022-07-18 MED ORDER — DIVALPROEX SODIUM ER 500 MG PO TB24: 500.0000 mg | ORAL_TABLET | Freq: Two times a day (BID) | ORAL | 1 refills | Status: DC

## 2022-07-18 MED ORDER — HYDROXYZINE HCL 10 MG PO TABS: 10.0000 mg | ORAL_TABLET | Freq: Three times a day (TID) | ORAL | 1 refills | Status: DC | PRN
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
## 2022-08-10 ENCOUNTER — Other Ambulatory Visit (HOSPITAL_COMMUNITY): Payer: Self-pay | Admitting: Psychiatry

## 2022-08-10 DIAGNOSIS — F4310 Post-traumatic stress disorder, unspecified: Secondary | ICD-10-CM

## 2022-08-10 DIAGNOSIS — F411 Generalized anxiety disorder: Secondary | ICD-10-CM

## 2022-09-05 ENCOUNTER — Encounter (HOSPITAL_COMMUNITY): Payer: Self-pay

## 2022-09-05 ENCOUNTER — Encounter (HOSPITAL_COMMUNITY): Payer: BC Managed Care – PPO | Admitting: Psychiatry

## 2022-09-05 NOTE — Progress Notes (Signed)
This encounter was created in error - please disregard.
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
## 2022-09-11 ENCOUNTER — Other Ambulatory Visit (HOSPITAL_COMMUNITY): Payer: Self-pay | Admitting: Psychiatry

## 2022-09-11 DIAGNOSIS — F411 Generalized anxiety disorder: Secondary | ICD-10-CM

## 2022-09-11 DIAGNOSIS — F4310 Post-traumatic stress disorder, unspecified: Secondary | ICD-10-CM

## 2022-09-15 ENCOUNTER — Other Ambulatory Visit (HOSPITAL_COMMUNITY): Payer: Self-pay | Admitting: Psychiatry

## 2022-09-15 DIAGNOSIS — F4310 Post-traumatic stress disorder, unspecified: Secondary | ICD-10-CM

## 2022-09-15 DIAGNOSIS — F411 Generalized anxiety disorder: Secondary | ICD-10-CM

## 2022-09-22 ENCOUNTER — Other Ambulatory Visit (HOSPITAL_COMMUNITY): Payer: Self-pay | Admitting: *Deleted

## 2022-09-22 DIAGNOSIS — F411 Generalized anxiety disorder: Secondary | ICD-10-CM

## 2022-09-22 DIAGNOSIS — F4310 Post-traumatic stress disorder, unspecified: Secondary | ICD-10-CM

## 2022-09-22 MED ORDER — HYDROXYZINE HCL 10 MG PO TABS
10.0000 mg | ORAL_TABLET | Freq: Three times a day (TID) | ORAL | 0 refills | Status: DC | PRN
Start: 2022-09-22 — End: 2023-01-17

## 2022-09-22 MED ORDER — DIVALPROEX SODIUM ER 500 MG PO TB24
500.0000 mg | ORAL_TABLET | Freq: Two times a day (BID) | ORAL | 0 refills | Status: DC
Start: 2022-09-22 — End: 2022-10-13

## 2022-10-11 ENCOUNTER — Other Ambulatory Visit (HOSPITAL_COMMUNITY): Payer: Self-pay | Admitting: Psychiatry

## 2022-10-11 DIAGNOSIS — F411 Generalized anxiety disorder: Secondary | ICD-10-CM

## 2022-10-11 DIAGNOSIS — F4310 Post-traumatic stress disorder, unspecified: Secondary | ICD-10-CM

## 2022-10-12 ENCOUNTER — Other Ambulatory Visit (HOSPITAL_COMMUNITY): Payer: Self-pay | Admitting: Psychiatry

## 2022-10-12 DIAGNOSIS — F411 Generalized anxiety disorder: Secondary | ICD-10-CM
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
## 2022-10-13 ENCOUNTER — Telehealth (HOSPITAL_BASED_OUTPATIENT_CLINIC_OR_DEPARTMENT_OTHER): Payer: Self-pay | Admitting: Psychiatry

## 2022-10-13 ENCOUNTER — Encounter (HOSPITAL_COMMUNITY): Payer: Self-pay | Admitting: Psychiatry

## 2022-10-13 DIAGNOSIS — Z79899 Other long term (current) drug therapy: Secondary | ICD-10-CM

## 2022-10-13 DIAGNOSIS — F411 Generalized anxiety disorder: Secondary | ICD-10-CM

## 2022-10-13 DIAGNOSIS — F4310 Post-traumatic stress disorder, unspecified: Secondary | ICD-10-CM

## 2022-10-13 MED ORDER — DULOXETINE HCL 30 MG PO CPEP
ORAL_CAPSULE | ORAL | 2 refills | Status: DC
Start: 2022-10-13 — End: 2022-12-22

## 2022-10-13 MED ORDER — ESCITALOPRAM OXALATE 10 MG PO TABS: 10.0000 mg | ORAL_TABLET | Freq: Every day | ORAL | 0 refills | Status: DC

## 2022-10-13 MED ORDER — DIVALPROEX SODIUM ER 500 MG PO TB24
500.0000 mg | ORAL_TABLET | Freq: Two times a day (BID) | ORAL | 2 refills | Status: DC
Start: 2022-10-13 — End: 2022-12-22

## 2022-10-19 ENCOUNTER — Ambulatory Visit (HOSPITAL_BASED_OUTPATIENT_CLINIC_OR_DEPARTMENT_OTHER): Payer: Self-pay

## 2022-10-19 ENCOUNTER — Encounter (HOSPITAL_COMMUNITY): Payer: Self-pay

## 2022-10-19 ENCOUNTER — Other Ambulatory Visit (HOSPITAL_COMMUNITY): Payer: Self-pay | Admitting: Psychiatry

## 2022-10-19 DIAGNOSIS — Z79899 Other long term (current) drug therapy: Secondary | ICD-10-CM

## 2022-10-19 NOTE — Progress Notes (Signed)
Patient arrived this morning for his due labs - Depakote level. Patient was pleasant and cooperative. He states that he had a bit of a panic attack on the way here because of traffic, but was calmed down by the time he got here. Labs were drawn via venipuncture in the patients right AC, patient tolerated well and without complaint.
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
## 2022-12-22 ENCOUNTER — Telehealth (HOSPITAL_BASED_OUTPATIENT_CLINIC_OR_DEPARTMENT_OTHER): Payer: Self-pay | Admitting: Psychiatry

## 2022-12-22 DIAGNOSIS — F411 Generalized anxiety disorder: Secondary | ICD-10-CM

## 2022-12-22 DIAGNOSIS — Z79899 Other long term (current) drug therapy: Secondary | ICD-10-CM

## 2022-12-22 DIAGNOSIS — F4310 Post-traumatic stress disorder, unspecified: Secondary | ICD-10-CM

## 2022-12-22 MED ORDER — DULOXETINE HCL 60 MG PO CPEP
60.0000 mg | ORAL_CAPSULE | Freq: Every day | ORAL | 2 refills | Status: DC
Start: 2022-12-22 — End: 2023-03-14

## 2022-12-22 MED ORDER — DIVALPROEX SODIUM ER 250 MG PO TB24
250.0000 mg | ORAL_TABLET | Freq: Two times a day (BID) | ORAL | 2 refills | Status: DC
Start: 2022-12-22 — End: 2023-03-14

## 2022-12-22 MED ORDER — DIVALPROEX SODIUM ER 500 MG PO TB24
500.0000 mg | ORAL_TABLET | Freq: Two times a day (BID) | ORAL | 2 refills | Status: DC
Start: 2022-12-22 — End: 2023-03-14

## 2022-12-23 ENCOUNTER — Encounter (HOSPITAL_COMMUNITY): Payer: Self-pay | Admitting: Psychiatry

## 2023-01-16 ENCOUNTER — Other Ambulatory Visit (HOSPITAL_COMMUNITY): Payer: Self-pay | Admitting: Psychiatry

## 2023-01-16 DIAGNOSIS — F4310 Post-traumatic stress disorder, unspecified: Secondary | ICD-10-CM

## 2023-01-16 DIAGNOSIS — F411 Generalized anxiety disorder: Secondary | ICD-10-CM

## 2023-02-02 ENCOUNTER — Telehealth (HOSPITAL_COMMUNITY): Payer: Self-pay | Admitting: Psychiatry

## 2023-03-07 ENCOUNTER — Other Ambulatory Visit (HOSPITAL_COMMUNITY): Payer: Self-pay | Admitting: Psychiatry

## 2023-03-07 DIAGNOSIS — F4310 Post-traumatic stress disorder, unspecified: Secondary | ICD-10-CM

## 2023-03-07 DIAGNOSIS — F411 Generalized anxiety disorder: Secondary | ICD-10-CM

## 2023-03-14 ENCOUNTER — Telehealth (HOSPITAL_BASED_OUTPATIENT_CLINIC_OR_DEPARTMENT_OTHER): Payer: Self-pay | Admitting: Psychiatry

## 2023-03-14 ENCOUNTER — Encounter (HOSPITAL_COMMUNITY): Payer: Self-pay | Admitting: Psychiatry

## 2023-03-14 DIAGNOSIS — F4310 Post-traumatic stress disorder, unspecified: Secondary | ICD-10-CM

## 2023-03-14 DIAGNOSIS — F411 Generalized anxiety disorder: Secondary | ICD-10-CM

## 2023-03-14 MED ORDER — DULOXETINE HCL 60 MG PO CPEP
60.0000 mg | ORAL_CAPSULE | Freq: Every day | ORAL | 2 refills | Status: DC
Start: 2023-03-14 — End: 2023-04-21
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
## 2023-04-12 ENCOUNTER — Ambulatory Visit (HOSPITAL_COMMUNITY): Payer: Self-pay | Admitting: Licensed Clinical Social Worker

## 2023-04-17 ENCOUNTER — Encounter (HOSPITAL_COMMUNITY): Payer: Self-pay

## 2023-04-17 ENCOUNTER — Ambulatory Visit (INDEPENDENT_AMBULATORY_CARE_PROVIDER_SITE_OTHER): Payer: Self-pay | Admitting: Licensed Clinical Social Worker

## 2023-04-17 DIAGNOSIS — F4310 Post-traumatic stress disorder, unspecified: Secondary | ICD-10-CM

## 2023-04-17 DIAGNOSIS — F411 Generalized anxiety disorder: Secondary | ICD-10-CM
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
## 2023-04-21 ENCOUNTER — Telehealth (HOSPITAL_BASED_OUTPATIENT_CLINIC_OR_DEPARTMENT_OTHER): Payer: Self-pay | Admitting: Psychiatry

## 2023-04-21 ENCOUNTER — Encounter (HOSPITAL_COMMUNITY): Payer: Self-pay | Admitting: Psychiatry

## 2023-04-21 DIAGNOSIS — F4310 Post-traumatic stress disorder, unspecified: Secondary | ICD-10-CM

## 2023-04-21 DIAGNOSIS — F411 Generalized anxiety disorder: Secondary | ICD-10-CM

## 2023-04-21 MED ORDER — DULOXETINE HCL 60 MG PO CPEP
60.0000 mg | ORAL_CAPSULE | Freq: Every day | ORAL | 2 refills | Status: DC
Start: 2023-04-21 — End: 2023-06-29

## 2023-04-21 MED ORDER — DIVALPROEX SODIUM ER 250 MG PO TB24
250.0000 mg | ORAL_TABLET | Freq: Every day | ORAL | 2 refills | Status: DC
Start: 2023-04-21 — End: 2023-06-29

## 2023-04-25 IMAGING — US US SCROTUM W/ DOPPLER COMPLETE
1 series · 14 of 25 positions shown · non-contrast
Comparison: None.

CLINICAL DATA: Left testicular pain.

EXAM:
SCROTAL ULTRASOUND
DOPPLER ULTRASOUND OF THE TESTICLES
TECHNIQUE: Complete ultrasound examination of the testicles, epididymis, and
other scrotal structures was performed. Color and spectral Doppler
ultrasound were also utilized to evaluate blood flow to the
testicles.

[Series 1: us scrotum w/doppler · 14 of 70 slices shown]
[im 1/70]
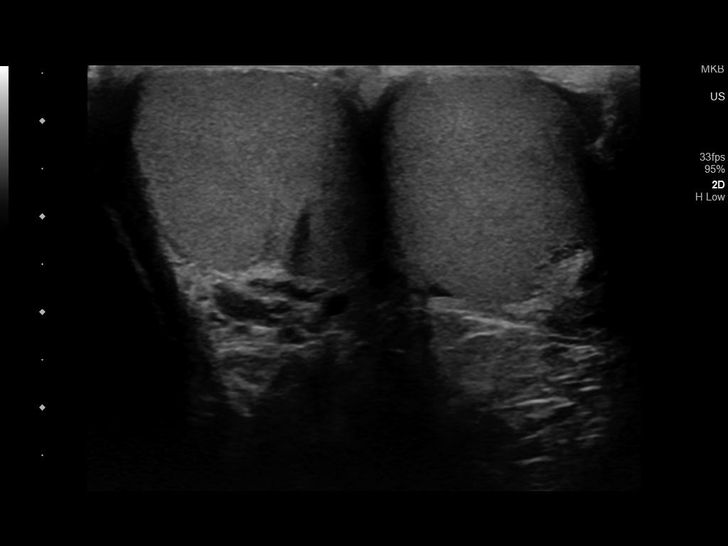
[im 6/70]
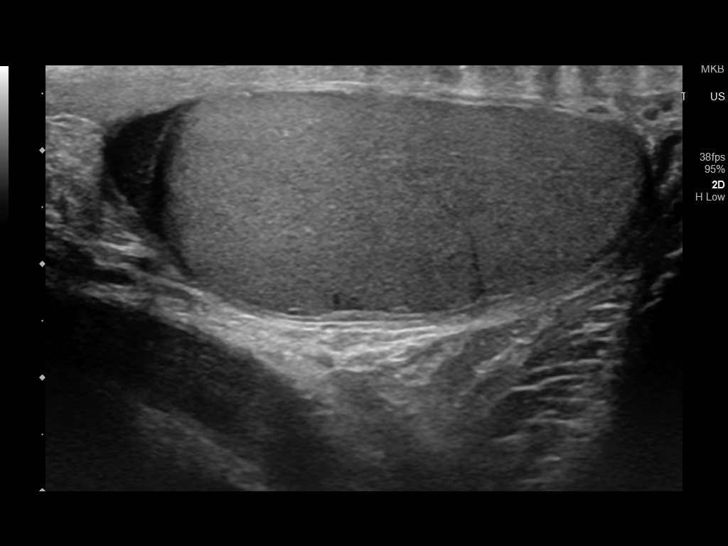
[im 12/70]
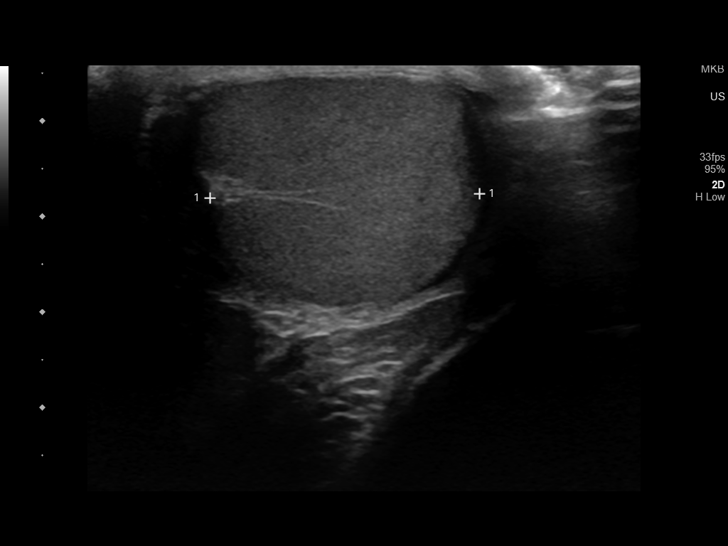
[im 18/70]
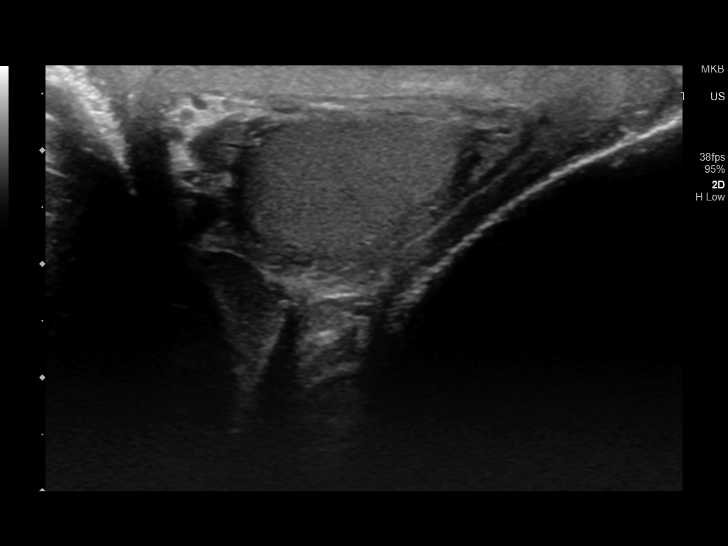
[im 24/70]
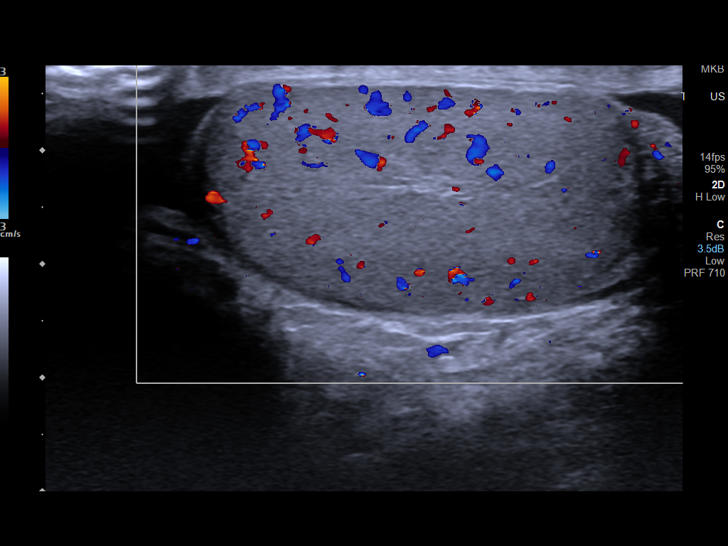
[im 26/70]
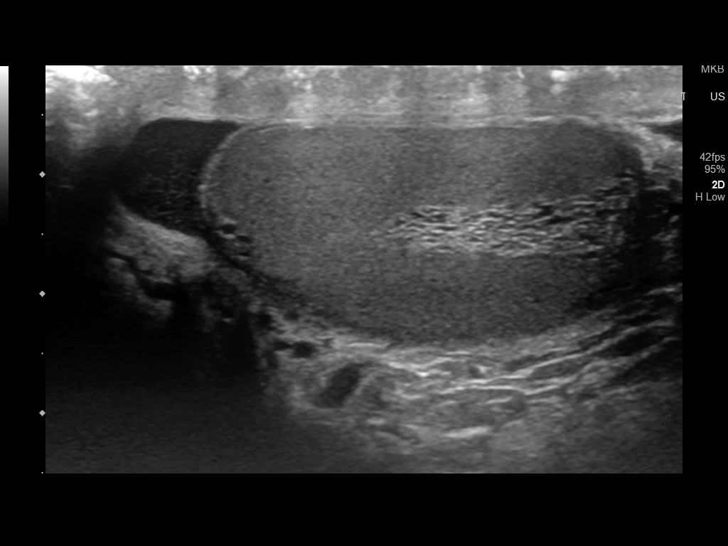
[im 32/70]
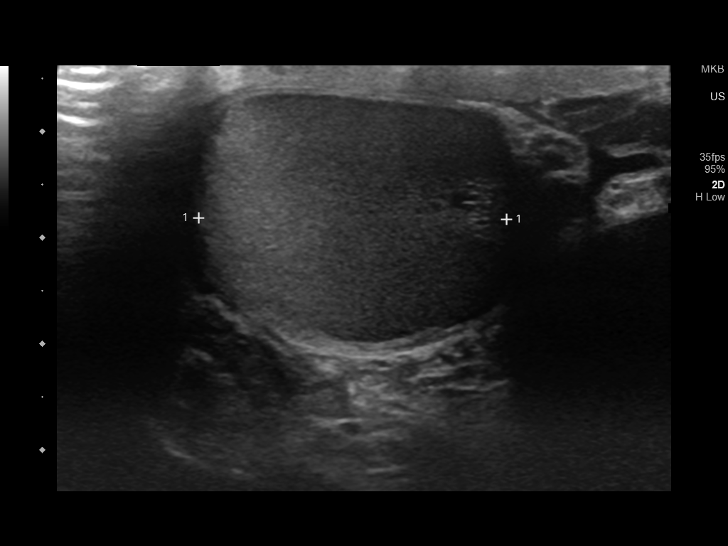
[im 38/70]
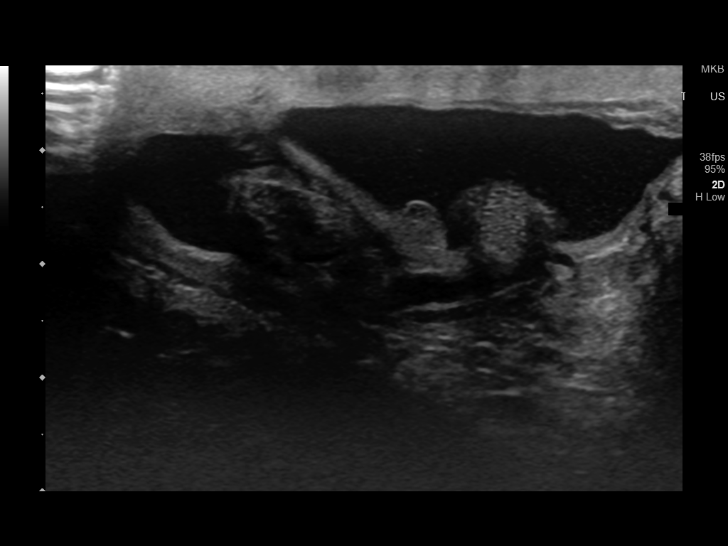
[im 44/70]
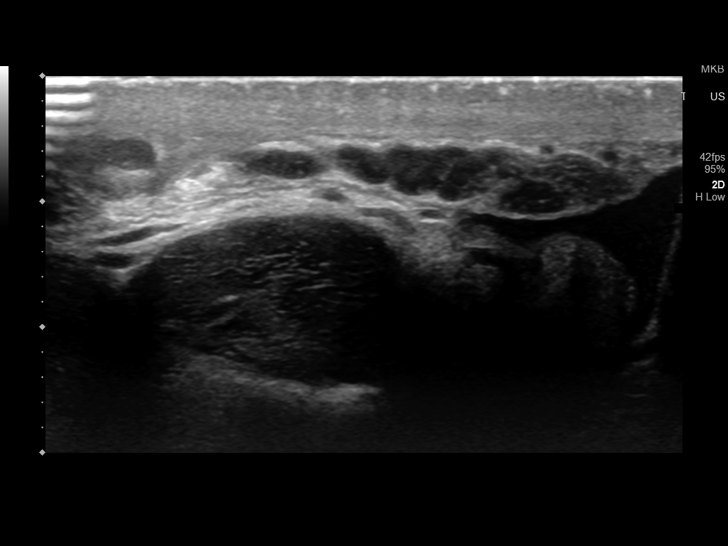
[im 47/70]
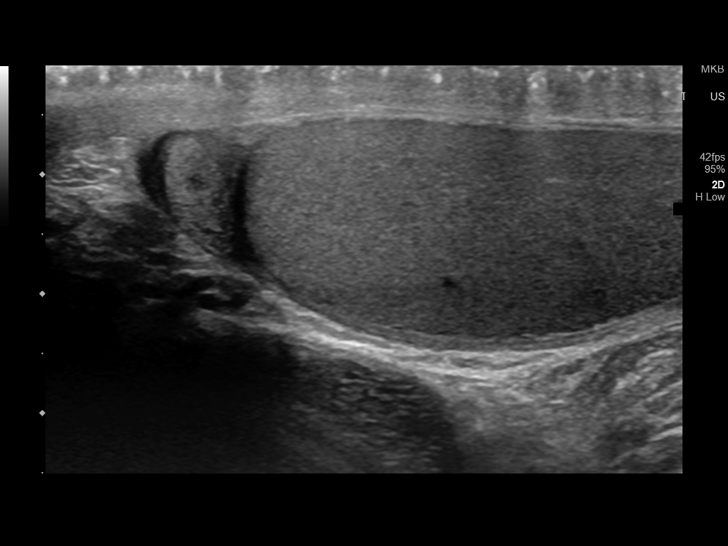
[im 52/70]
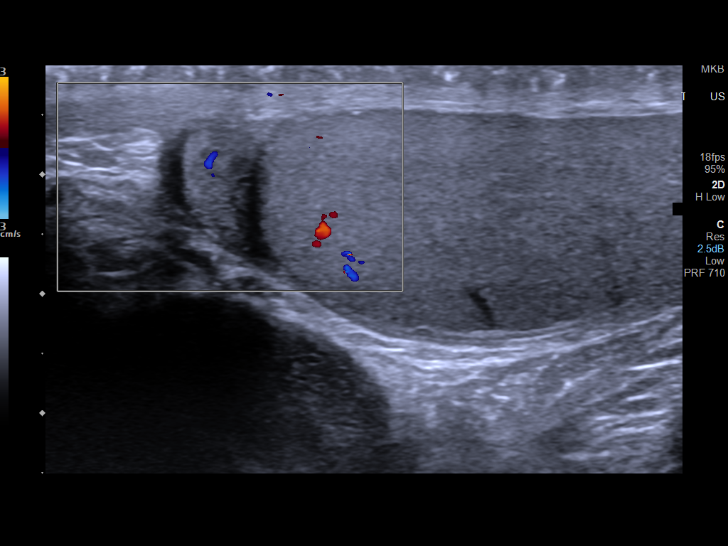
[im 58/70]
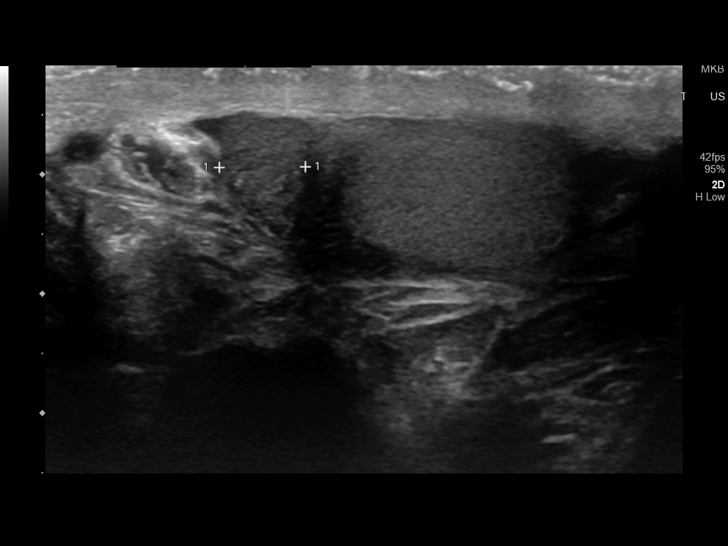
[im 64/70]
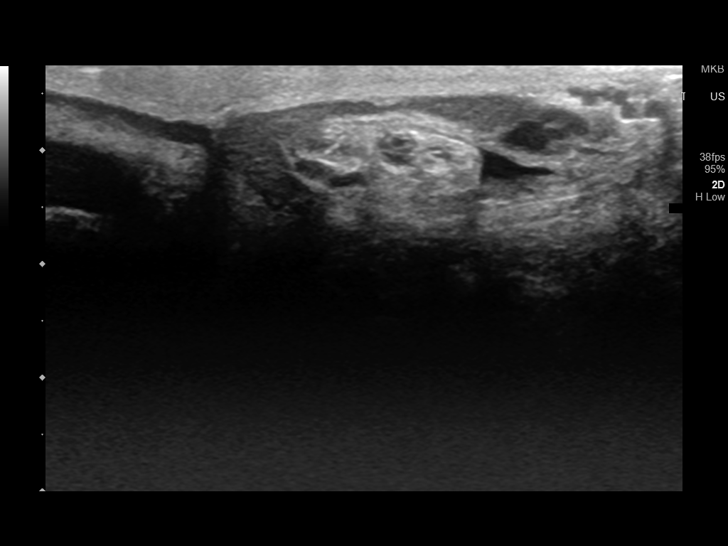
[im 70/70]
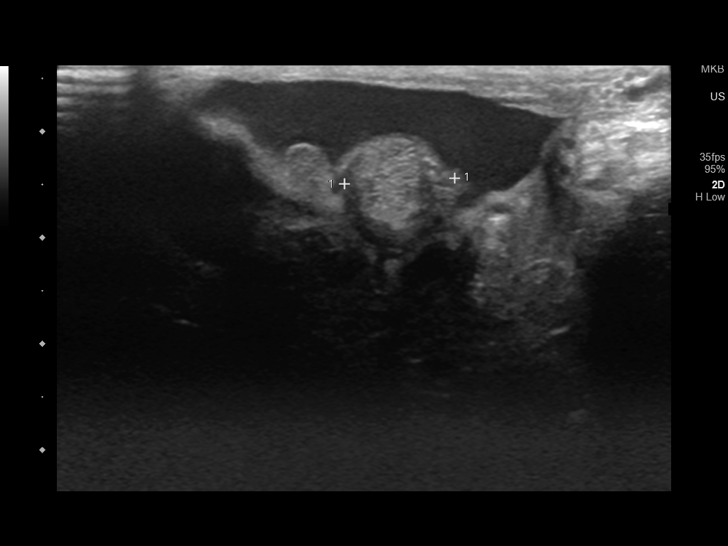

[14 of 25 positions shown; findings below may reference images not displayed]

FINDINGS: Right testicle

Measurements: 4.2 x 2.1 x 2.8 cm. No mass or microlithiasis
visualized.

Left testicle

Measurements: 3.7 x 2.4 x 2.9 cm. No mass or microlithiasis
visualized.

Right epididymis:  Normal in size and appearance.

Left epididymis:  Normal in size and appearance.

Hydrocele:  Small bilateral hydrocele.

Varicocele:  None visualized.

Pulsed Doppler interrogation of both testes demonstrates normal low
resistance arterial and venous waveforms bilaterally.
IMPRESSION: Normal appearance of the testicles.

Small bilateral hydrocele.

## 2023-05-15 ENCOUNTER — Ambulatory Visit (INDEPENDENT_AMBULATORY_CARE_PROVIDER_SITE_OTHER): Payer: Self-pay | Admitting: Licensed Clinical Social Worker

## 2023-05-15 DIAGNOSIS — Z79899 Other long term (current) drug therapy: Secondary | ICD-10-CM

## 2023-05-15 NOTE — Progress Notes (Signed)
 THERAPIST PROGRESS NOTE   Session Date: 05/15/2023  Session Time: 1400 - 1415  [name]    Clinician attempted to connect with patient for scheduled appointment via MyChart video, sending text request x2 with no response.    Attempt 1: Text: 1405  Attempt 2: Text: 1408   Pt connected at 1410, at which time clinician observed pt to be driving, advising of safety concerns to facilitate session while driving, asking of opportunity to pull over for 40-45 minutes for session. Pt confirmed inability to pull over due to job time constraints. Clinician informed pt of need to reschedule visit for alternate date/time, confirming availability of 3/10 at 1100. Pt confirmed things to be going well at this time, adjusting to medication dosage well and improvements in moods and interactions within relationship.  Disconnected video call at 1415.   Leisa Lenz, MSW, LCSW 05/15/2023,  2:17 PM

## 2023-05-16 ENCOUNTER — Other Ambulatory Visit: Payer: Self-pay

## 2023-05-22 ENCOUNTER — Ambulatory Visit (INDEPENDENT_AMBULATORY_CARE_PROVIDER_SITE_OTHER): Payer: Self-pay | Admitting: Licensed Clinical Social Worker

## 2023-05-22 DIAGNOSIS — F411 Generalized anxiety disorder: Secondary | ICD-10-CM | POA: Diagnosis not present

## 2023-05-22 DIAGNOSIS — F4310 Post-traumatic stress disorder, unspecified: Secondary | ICD-10-CM
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
## 2023-05-29 ENCOUNTER — Ambulatory Visit (INDEPENDENT_AMBULATORY_CARE_PROVIDER_SITE_OTHER): Payer: Self-pay | Admitting: Licensed Clinical Social Worker

## 2023-05-29 ENCOUNTER — Encounter (HOSPITAL_COMMUNITY): Payer: Self-pay

## 2023-05-29 DIAGNOSIS — Z91199 Patient's noncompliance with other medical treatment and regimen due to unspecified reason: Secondary | ICD-10-CM
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
## 2023-05-29 NOTE — Progress Notes (Signed)
 THERAPIST PROGRESS NOTE   Session Date: 05/29/2023  Session Time: 1608 - 1414  [name]    Clinician attempted to connect with patient for scheduled appointment via Caregility video.  Pt connected at 1408, sharing of having been delayed with work. Pt expressed inabilities to maintain appt, requesting appt be rescheduled. Provider notified pt of abilities to reschedule appt to later date this week. Informed pt of rescheduling of future appt's to prevent having multiple appts within 3-4 days of each other, encouraging to check MyChart for appt details.  Disconnected video call at 1414.   Leisa Lenz, MSW, LCSW 05/29/2023,  4:21 PM

## 2023-05-31 ENCOUNTER — Ambulatory Visit (INDEPENDENT_AMBULATORY_CARE_PROVIDER_SITE_OTHER): Admitting: Licensed Clinical Social Worker

## 2023-05-31 DIAGNOSIS — F4310 Post-traumatic stress disorder, unspecified: Secondary | ICD-10-CM

## 2023-05-31 DIAGNOSIS — F411 Generalized anxiety disorder: Secondary | ICD-10-CM | POA: Diagnosis not present

## 2023-05-31 NOTE — Progress Notes (Signed)
 THERAPIST PROGRESS NOTE   Session Date: 05/31/2023  Session Time: 1105 - 1156 Virtual Visit via Video Note  I connected with Daryl Chandler on 05/31/23 at 11:05 AM EDT by a video enabled telemedicine application and verified that I am speaking with the correct person using two identifiers.  Location: Patient: In work [employer]; parked outside current job location. Provider: [facility] [place] [facility]   I discussed the limitations of evaluation and management by telemedicine and the availability of in person appointments. The patient expressed understanding and agreed to proceed.   I discussed the assessment and treatment plan with the patient. The patient was provided an opportunity to ask questions and all were answered. The patient agreed with the plan and demonstrated an understanding of the instructions.   The patient was advised to call back or seek an in-person evaluation if the symptoms worsen or if the condition fails to improve as anticipated.  I provided 50 minutes of non-face-to-face time during this encounter.  Participation Level: Active  Behavioral Response: CasualAlertEuthymic  Type of Therapy: Individual Therapy  Treatment Goals addressed:  Anxiety    STG: Report a decrease in anxiety symptoms as evidenced by an overall reduction in anxiety score by a minimum of 25% on the Generalized Anxiety Disorder Scale (GAD-7)     LTG: "Be at a good point to not have to depend on the medication anymore"       BH CCP Acute or Chronic Trauma Reaction    LTG: Recall traumatic events without becoming overwhelmed with negative emotions     STG: Daryl Chandler "Daryl Chandler" will identify internal and external stimuli that trigger PTSD symptoms        ProgressTowards Goals: Initial  Interventions: CBT, Motivational Interviewing, Solution Focused, Strength-based, and Supportive  Summary: Daryl Chandler is a 43 y.o. male with past psych history of GAD and PTSD, presenting for follow-up therapy session in  efforts to improve management of anxious and PTSD related symptoms. Patient actively engaged in session, presenting in overall pleasant moods with congruent affect throughout.   - Patient openly engaged, sharing details of things overall going good, being smooth, with moods having not proven to be labile or challenging to manage as of late.  Patient identified of continued stress surrounding battling addiction day-to-day, noticing greatest urges mostly in the mornings, however shares beliefs to be controlling it well, expressing feeling that the longer it gets the clearer he feels he can think as well as the longer he refrains from use, the greater the feeling of happiness with recovery and overall enjoyment, and serves to motivate.  Patient shared throughout recent weeks of having engaged in treatment, having spent time reflecting on areas of discussion and finding himself having started to feel better.patient continued to detail the more he is able to talk about things, and hear himself talk about his upbringing, trauma, prolonged exposure to stress, and extensive history with anxiety and substance use, he finds that he feels better, himself to feel the emotions, and being able to admit certain things that typically wouldn't do so with others, specifically noting of allowing himself to be vulnerable with himself throughout this process. - Explored presence and/or challenges related to anxiety throughout the past week, loading of the medication regimen to be helping significantly, and his anxiety is not as intense, and may experience gradual onset of anxious symptoms, acknowledging of being nowhere near as was a year or so ago, specifically noting of hx of increased anger and aggression for points of reference.  Patient responded well to interventions. Patient continues to meet criteria for GAD and PTSD. Patient will continue to benefit from engagement in outpatient therapy due to being the least restrictive  service to meet presenting needs.      05/22/2023   11:21 AM 04/17/2023    1:23 PM 12/27/2021    4:02 PM  Depression screen PHQ 2/9  Decreased Interest 0 0 1  Down, Depressed, Hopeless 0 2 1  PHQ - 2 Score 0 2 2  Altered sleeping 0 0 0  Tired, decreased energy 0 0 0  Change in appetite 1 2 1   Feeling bad or failure about yourself  0 2 2  Trouble concentrating 0 3 3  Moving slowly or fidgety/restless 0 0 0  Suicidal thoughts 0 0 0  PHQ-9 Score 1 9 8   Difficult doing work/chores Not difficult at all Somewhat difficult       05/22/2023   11:19 AM 04/17/2023    1:28 PM 12/27/2021    4:36 PM  GAD 7 : Generalized Anxiety Score  Nervous, Anxious, on Edge 2 2 1   Control/stop worrying 2 2 2   Worry too much - different things 2 3 3   Trouble relaxing 2 3 2   Restless 2 3 2   Easily annoyed or irritable 2 3 3   Afraid - awful might happen 2 2 1   Total GAD 7 Score 14 18 14   Anxiety Difficulty Not difficult at all Somewhat difficult      Suicidal/Homicidal: Nowithout intent/plan  Therapist Response: Clinician utilized CBT, MI, Solution Focused, Strengths based, and supportive reflections to address presenting stressors and identified challenges.   - Actively engaged patient in introductory check-in, assessing presenting moods and affect, utilizing open-ended questions and efforts to elicit patient's recounts of recent events since previous visit, actively listening to patient's reports of past week, supporting patient's reflections of events and maintaining stability. - Utilized CBT and MI techniques to support patient in processing thoughts and feelings in relation to maintaining sobriety and factors related to continued experienced urges to use, further utilizing Socratic questioning and aims of greater exploration of expressed thoughts and feelings surrounding history of substance use, in efforts to support patient in processing how history of use impacted prior thought patterns, emotional  dysregulation, and behaviors. -Provided patient support and processing history of ways in which anxiety and chronic stress have proved to impact patient's abilities to emotionally regulate.  Clinician reassessed severity of presenting sxs, and presence of any safety concerns. Clinician provided support and empathy to patient during session.  Plan: Return again in 1 weeks.  Diagnosis:  Encounter Diagnoses  Name Primary?   GAD (generalized anxiety disorder) Yes   PTSD (post-traumatic stress disorder)      Collaboration of Care: Psychiatrist AEB Provider documentation available in EHR.  Patient/Guardian was advised Release of Information must be obtained prior to any record release in order to collaborate their care with an outside provider. Patient/Guardian was advised if they have not already done so to contact the registration department to sign all necessary forms in order for [place] to release information regarding their care.   Consent: Patient/Guardian gives verbal consent for treatment and assignment of benefits for services provided during this visit. Patient/Guardian expressed understanding and agreed to proceed.   Leisa Lenz, MSW, LCSW 05/31/2023,  7:30 PM

## 2023-06-01 ENCOUNTER — Encounter (HOSPITAL_COMMUNITY): Payer: Self-pay | Admitting: Psychiatry

## 2023-06-01 ENCOUNTER — Encounter (HOSPITAL_COMMUNITY): Payer: Self-pay

## 2023-06-01 DIAGNOSIS — F4310 Post-traumatic stress disorder, unspecified: Secondary | ICD-10-CM

## 2023-06-01 DIAGNOSIS — F411 Generalized anxiety disorder: Secondary | ICD-10-CM

## 2023-06-01 NOTE — Progress Notes (Signed)
 This encounter was created in error - please disregard.  Patient was contacted via telephone upon not arriving for his appointment.  He reported that he had forgot about the appointment however was currently driving and unable to join.  He had mentioned being able to join once he reached his destination in a few minutes however upon getting there had been unable to join at that time as well.  Was able to speak with the patient via telephone 1 more time and and explained that appointment would be marked as a no-show though we can reschedule him for April 3 at 430.  Patient expressed his understanding.

## 2023-06-08 ENCOUNTER — Ambulatory Visit (INDEPENDENT_AMBULATORY_CARE_PROVIDER_SITE_OTHER): Payer: Self-pay | Admitting: Licensed Clinical Social Worker

## 2023-06-08 DIAGNOSIS — Z91199 Patient's noncompliance with other medical treatment and regimen due to unspecified reason: Secondary | ICD-10-CM

## 2023-06-08 NOTE — Progress Notes (Signed)
 THERAPIST PROGRESS NOTE   Session Date: 06/08/2023  Session Time: 1100 - 1115  [name]    Clinician attempted to connect with patient for scheduled appointment via Caregility video, sending text request x2 with no response.    Attempt 1: Text: 1110  Attempt 2: Text: 1113   Disconnected video visit at: 1115.    Per [facility] policy, after multiple attempts to reach patient unsuccessfully at appointed time, visit will be coded as a no show.  Leisa Lenz, MSW, LCSW 06/08/2023,  11:17 AM

## 2023-06-12 ENCOUNTER — Ambulatory Visit (HOSPITAL_COMMUNITY): Payer: Self-pay | Admitting: Licensed Clinical Social Worker
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
## 2023-06-15 ENCOUNTER — Encounter (HOSPITAL_COMMUNITY): Payer: Self-pay

## 2023-06-15 ENCOUNTER — Encounter (HOSPITAL_COMMUNITY): Admitting: Psychiatry

## 2023-06-15 NOTE — Progress Notes (Signed)
 This encounter was created in error - please disregard.  Patient called to cancel his 4:30 PM appointment at 3:40 on the same day.  He listed that he was at work and would be unable to attend.  Patient rescheduled for 4/17.  Of note patient does have 2 no-shows in the past month with this provider.

## 2023-06-16 ENCOUNTER — Ambulatory Visit (INDEPENDENT_AMBULATORY_CARE_PROVIDER_SITE_OTHER): Payer: Self-pay | Admitting: Licensed Clinical Social Worker

## 2023-06-16 ENCOUNTER — Encounter (HOSPITAL_COMMUNITY): Payer: Self-pay

## 2023-06-16 DIAGNOSIS — Z91199 Patient's noncompliance with other medical treatment and regimen due to unspecified reason: Secondary | ICD-10-CM

## 2023-06-16 NOTE — Progress Notes (Signed)
 THERAPIST PROGRESS NOTE   Session Date: 06/16/2023  Session Time: 1100  [name]    Clinician attempted to connect with patient for scheduled appointment via Caregility video, sending text request x2 with no response.    Attempt 1: Text: 1105  Attempt 2: Text: 1108   Disconnected video visit at: 1112.    Per [facility] policy, after multiple attempts to reach patient unsuccessfully at appointed time, visit will be coded as a no show.  Leisa Lenz, MSW, LCSW 06/16/2023,  11:12 AM
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
## 2023-06-27 ENCOUNTER — Encounter (HOSPITAL_COMMUNITY): Payer: Self-pay

## 2023-06-27 ENCOUNTER — Ambulatory Visit (INDEPENDENT_AMBULATORY_CARE_PROVIDER_SITE_OTHER): Admitting: Licensed Clinical Social Worker

## 2023-06-27 ENCOUNTER — Encounter (HOSPITAL_COMMUNITY): Payer: Self-pay | Admitting: Licensed Clinical Social Worker

## 2023-06-27 DIAGNOSIS — Z91199 Patient's noncompliance with other medical treatment and regimen due to unspecified reason: Secondary | ICD-10-CM

## 2023-06-27 NOTE — Progress Notes (Addendum)
 THERAPIST PROGRESS NOTE   Session Date: 06/27/2023  Session Time: 1300 - 1312  Court Distance    Clinician attempted to connect with patient for scheduled appointment via Caregility video, sending text request x3 with no response.     Attempt 1: Text: 1305   Attempt 2: Text: 1308    Attempt 3: Text: 1309   Disconnected video visit at:  1312     Per [place] policy, after multiple attempts to reach patient unsuccessfully at appointed time, visit will be coded as a no show.  Pt will be discharged from provider's care due to no-shows and/or inabilities to meet at scheduled time on (03/03, 03/17, 03/27, 04/04, and 04/15). Office personnel will mail official discharge notice to address on file.  Patsi Boots, MSW, LCSW 06/27/2023,  1:12 PM

## 2023-06-29 ENCOUNTER — Encounter (HOSPITAL_COMMUNITY): Payer: Self-pay | Admitting: Psychiatry

## 2023-06-29 ENCOUNTER — Telehealth (HOSPITAL_COMMUNITY): Admitting: Psychiatry

## 2023-06-29 DIAGNOSIS — F411 Generalized anxiety disorder: Secondary | ICD-10-CM | POA: Diagnosis not present

## 2023-06-29 DIAGNOSIS — F4310 Post-traumatic stress disorder, unspecified: Secondary | ICD-10-CM

## 2023-06-29 MED ORDER — DIVALPROEX SODIUM ER 250 MG PO TB24
250.0000 mg | ORAL_TABLET | Freq: Every day | ORAL | 2 refills | Status: DC
Start: 2023-06-29 — End: 2023-08-31

## 2023-06-29 MED ORDER — DULOXETINE HCL 60 MG PO CPEP: 60.0000 mg | ORAL_CAPSULE | Freq: Every day | ORAL | 2 refills | Status: DC

## 2023-07-04 ENCOUNTER — Ambulatory Visit (HOSPITAL_COMMUNITY): Admitting: Licensed Clinical Social Worker

## 2023-07-17 ENCOUNTER — Telehealth (HOSPITAL_COMMUNITY): Payer: Self-pay | Admitting: Licensed Clinical Social Worker

## 2023-07-17 NOTE — Telephone Encounter (Signed)
 07/17/23 Certified mailed [phone]
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
## 2023-08-31 ENCOUNTER — Telehealth (HOSPITAL_COMMUNITY): Admitting: Psychiatry

## 2023-08-31 ENCOUNTER — Encounter (HOSPITAL_COMMUNITY): Payer: Self-pay | Admitting: Psychiatry

## 2023-08-31 DIAGNOSIS — F411 Generalized anxiety disorder: Secondary | ICD-10-CM | POA: Diagnosis not present

## 2023-08-31 DIAGNOSIS — F4310 Post-traumatic stress disorder, unspecified: Secondary | ICD-10-CM

## 2023-08-31 MED ORDER — DIVALPROEX SODIUM ER 250 MG PO TB24: 250.0000 mg | ORAL_TABLET | Freq: Every day | ORAL | 2 refills | Status: DC

## 2023-08-31 MED ORDER — HYDROXYZINE HCL 10 MG PO TABS
10.0000 mg | ORAL_TABLET | Freq: Three times a day (TID) | ORAL | 1 refills | Status: AC | PRN
Start: 2023-08-31 — End: ?

## 2023-08-31 MED ORDER — DULOXETINE HCL 60 MG PO CPEP: 60.0000 mg | ORAL_CAPSULE | Freq: Every day | ORAL | 2 refills | Status: DC

## 2023-09-24 ENCOUNTER — Other Ambulatory Visit (HOSPITAL_COMMUNITY): Payer: Self-pay | Admitting: Psychiatry

## 2023-09-24 DIAGNOSIS — F411 Generalized anxiety disorder: Secondary | ICD-10-CM

## 2023-09-24 DIAGNOSIS — F4310 Post-traumatic stress disorder, unspecified: Secondary | ICD-10-CM
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
## 2023-11-30 ENCOUNTER — Telehealth (HOSPITAL_BASED_OUTPATIENT_CLINIC_OR_DEPARTMENT_OTHER): Admitting: Psychiatry

## 2023-11-30 ENCOUNTER — Encounter (HOSPITAL_COMMUNITY): Payer: Self-pay | Admitting: Psychiatry

## 2023-11-30 DIAGNOSIS — F411 Generalized anxiety disorder: Secondary | ICD-10-CM | POA: Diagnosis not present

## 2023-11-30 DIAGNOSIS — F4310 Post-traumatic stress disorder, unspecified: Secondary | ICD-10-CM | POA: Diagnosis not present

## 2023-11-30 DIAGNOSIS — F17213 Nicotine dependence, cigarettes, with withdrawal: Secondary | ICD-10-CM

## 2023-11-30 MED ORDER — VARENICLINE TARTRATE 1 MG PO TABS: ORAL_TABLET | ORAL | 0 refills | Status: AC

## 2023-11-30 MED ORDER — VARENICLINE TARTRATE 1 MG PO TABS: 1.0000 mg | ORAL_TABLET | Freq: Two times a day (BID) | ORAL | 2 refills | Status: DC

## 2023-11-30 MED ORDER — DIVALPROEX SODIUM ER 250 MG PO TB24: 250.0000 mg | ORAL_TABLET | Freq: Every day | ORAL | 0 refills | Status: DC

## 2023-11-30 MED ORDER — DULOXETINE HCL 60 MG PO CPEP: 60.0000 mg | ORAL_CAPSULE | Freq: Every day | ORAL | 0 refills | Status: DC
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
## 2023-11-30 NOTE — Progress Notes (Deleted)
 This encounter was created in error - please disregard.
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
## 2024-02-22 ENCOUNTER — Encounter (HOSPITAL_COMMUNITY): Payer: Self-pay | Admitting: Psychiatry

## 2024-02-22 ENCOUNTER — Telehealth (HOSPITAL_COMMUNITY): Admitting: Psychiatry

## 2024-02-22 DIAGNOSIS — F17213 Nicotine dependence, cigarettes, with withdrawal: Secondary | ICD-10-CM

## 2024-02-22 DIAGNOSIS — F4310 Post-traumatic stress disorder, unspecified: Secondary | ICD-10-CM

## 2024-02-22 DIAGNOSIS — F411 Generalized anxiety disorder: Secondary | ICD-10-CM

## 2024-02-22 DIAGNOSIS — F17210 Nicotine dependence, cigarettes, uncomplicated: Secondary | ICD-10-CM

## 2024-02-22 MED ORDER — VARENICLINE TARTRATE 1 MG PO TABS: 1.0000 mg | ORAL_TABLET | Freq: Two times a day (BID) | ORAL | 2 refills | Status: DC

## 2024-02-22 MED ORDER — DULOXETINE HCL 60 MG PO CPEP: 60.0000 mg | ORAL_CAPSULE | Freq: Every day | ORAL | 0 refills | Status: AC

## 2024-02-22 MED ORDER — DIVALPROEX SODIUM ER 250 MG PO TB24: 250.0000 mg | ORAL_TABLET | Freq: Every day | ORAL | 0 refills | Status: AC

## 2024-04-12 ENCOUNTER — Encounter: Payer: Self-pay | Admitting: Orthopedic Surgery

## 2024-04-12 ENCOUNTER — Other Ambulatory Visit: Payer: Self-pay

## 2024-04-12 ENCOUNTER — Inpatient Hospital Stay
Admission: EM | Admit: 2024-04-12 | Discharge: 2024-04-15 | Disposition: A | Attending: Orthopedic Surgery | Admitting: Orthopedic Surgery

## 2024-04-12 ENCOUNTER — Emergency Department

## 2024-04-12 DIAGNOSIS — S82209A Unspecified fracture of shaft of unspecified tibia, initial encounter for closed fracture: Secondary | ICD-10-CM | POA: Diagnosis present

## 2024-04-12 DIAGNOSIS — W19XXXA Unspecified fall, initial encounter: Principal | ICD-10-CM

## 2024-04-12 DIAGNOSIS — S82241A Displaced spiral fracture of shaft of right tibia, initial encounter for closed fracture: Secondary | ICD-10-CM

## 2024-04-12 HISTORY — DX: Anxiety disorder, unspecified: F41.9

## 2024-04-12 HISTORY — DX: Depression, unspecified: F32.A

## 2024-04-12 LAB — BASIC METABOLIC PANEL WITH GFR
Anion gap: 13 (ref 5–15)
BUN: 14 mg/dL (ref 6–20)
CO2: 24 mmol/L (ref 22–32)
Calcium: 9 mg/dL (ref 8.9–10.3)
Chloride: 100 mmol/L (ref 98–111)
Creatinine, Ser: 0.9 mg/dL (ref 0.61–1.24)
GFR, Estimated: >60 mL/min
Glucose, Bld: 107 mg/dL — ABNORMAL HIGH (ref 70–99)
Potassium: 4 mmol/L (ref 3.5–5.1)
Sodium: 138 mmol/L (ref 135–145)

## 2024-04-12 LAB — CBC WITH DIFFERENTIAL/PLATELET
Abs Immature Granulocytes: 0.04 10*3/uL (ref 0.00–0.07)
Basophils Absolute: 0 10*3/uL (ref 0.0–0.1)
Basophils Relative: 0 %
Eosinophils Absolute: 0.1 10*3/uL (ref 0.0–0.5)
Eosinophils Relative: 1 %
HCT: 42.1 % (ref 39.0–52.0)
Hemoglobin: 14.2 g/dL (ref 13.0–17.0)
Immature Granulocytes: 0 %
Lymphocytes Relative: 19 %
Lymphs Abs: 2.1 10*3/uL (ref 0.7–4.0)
MCH: 28.5 pg (ref 26.0–34.0)
MCHC: 33.7 g/dL (ref 30.0–36.0)
MCV: 84.4 fL (ref 80.0–100.0)
Monocytes Absolute: 0.9 10*3/uL (ref 0.1–1.0)
Monocytes Relative: 8 %
Neutro Abs: 8 10*3/uL — ABNORMAL HIGH (ref 1.7–7.7)
Neutrophils Relative %: 72 %
Platelets: 244 10*3/uL (ref 150–400)
RBC: 4.99 MIL/uL (ref 4.22–5.81)
RDW: 12.4 % (ref 11.5–15.5)
WBC: 11.2 10*3/uL — ABNORMAL HIGH (ref 4.0–10.5)
nRBC: 0 % (ref 0.0–0.2)

## 2024-04-12 LAB — PROTIME-INR
INR: 1 (ref 0.8–1.2)
Prothrombin Time: 14.1 s (ref 11.4–15.2)

## 2024-04-12 LAB — TYPE AND SCREEN
ABO/RH(D): A NEG
Antibody Screen: NEGATIVE

## 2024-04-12 MED ORDER — ONDANSETRON HCL 4 MG PO TABS: 4.0000 mg | ORAL_TABLET | Freq: Four times a day (QID) | ORAL | Status: DC | PRN

## 2024-04-12 MED ORDER — ONDANSETRON HCL 4 MG/2ML IJ SOLN
4.0000 mg | Freq: Once | INTRAMUSCULAR | Status: AC
  Administered 2024-04-12: 4 mg via INTRAVENOUS
  Filled 2024-04-12: qty 2

## 2024-04-12 MED ORDER — CEFAZOLIN SODIUM-DEXTROSE 2-4 GM/100ML-% IV SOLN
2.0000 g | INTRAVENOUS | Status: AC
  Administered 2024-04-13: 2 g via INTRAVENOUS

## 2024-04-12 MED ORDER — METHOCARBAMOL 500 MG PO TABS
500.0000 mg | ORAL_TABLET | Freq: Four times a day (QID) | ORAL | Status: DC | PRN
  Administered 2024-04-13: 500 mg via ORAL
  Filled 2024-04-12: qty 1

## 2024-04-12 MED ORDER — DIPHENHYDRAMINE HCL 12.5 MG/5ML PO ELIX: 12.5000 mg | ORAL_SOLUTION | ORAL | Status: DC | PRN

## 2024-04-12 MED ORDER — HYDROMORPHONE HCL 1 MG/ML IJ SOLN
1.0000 mg | Freq: Once | INTRAMUSCULAR | Status: AC
  Administered 2024-04-12: 1 mg via INTRAVENOUS
  Filled 2024-04-12: qty 1

## 2024-04-12 MED ORDER — OXYCODONE HCL 5 MG PO TABS: 5.0000 mg | ORAL_TABLET | ORAL | Status: DC | PRN

## 2024-04-12 MED ORDER — DOCUSATE SODIUM 100 MG PO CAPS
100.0000 mg | ORAL_CAPSULE | Freq: Two times a day (BID) | ORAL | Status: DC
  Administered 2024-04-12: 100 mg via ORAL
  Filled 2024-04-12: qty 1

## 2024-04-12 MED ORDER — HYDROMORPHONE HCL 1 MG/ML IJ SOLN
0.2000 mg | INTRAMUSCULAR | Status: DC | PRN
  Administered 2024-04-12 – 2024-04-13 (×2): 0.4 mg via INTRAVENOUS
  Filled 2024-04-12 (×2): qty 0.5

## 2024-04-12 MED ORDER — FENTANYL CITRATE (PF) 50 MCG/ML IJ SOSY
100.0000 ug | PREFILLED_SYRINGE | Freq: Once | INTRAMUSCULAR | Status: AC
  Administered 2024-04-12: 100 ug via INTRAVENOUS
  Filled 2024-04-12: qty 2

## 2024-04-12 MED ORDER — ONDANSETRON HCL 4 MG/2ML IJ SOLN: 4.0000 mg | Freq: Four times a day (QID) | INTRAMUSCULAR | Status: DC | PRN

## 2024-04-12 MED ORDER — ACETAMINOPHEN 500 MG PO TABS
1000.0000 mg | ORAL_TABLET | Freq: Three times a day (TID) | ORAL | Status: DC
  Administered 2024-04-12: 1000 mg via ORAL
  Filled 2024-04-12: qty 2

## 2024-04-12 MED ORDER — OXYCODONE HCL 5 MG PO TABS
10.0000 mg | ORAL_TABLET | ORAL | Status: DC | PRN
  Administered 2024-04-12 – 2024-04-13 (×2): 15 mg via ORAL
  Filled 2024-04-12 (×2): qty 3

## 2024-04-12 MED ORDER — SENNOSIDES-DOCUSATE SODIUM 8.6-50 MG PO TABS: 1.0000 | ORAL_TABLET | Freq: Every evening | ORAL | Status: DC | PRN

## 2024-04-12 MED ORDER — METHOCARBAMOL 1000 MG/10ML IJ SOLN: 500.0000 mg | Freq: Four times a day (QID) | INTRAMUSCULAR | Status: DC | PRN
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
## 2024-04-13 ENCOUNTER — Inpatient Hospital Stay

## 2024-04-13 ENCOUNTER — Inpatient Hospital Stay: Admitting: Registered Nurse

## 2024-04-13 ENCOUNTER — Encounter: Admission: EM | Disposition: A | Payer: Self-pay | Source: Home / Self Care | Attending: Orthopedic Surgery

## 2024-04-13 MED ORDER — ASPIRIN 325 MG PO TBEC
325.0000 mg | DELAYED_RELEASE_TABLET | Freq: Every day | ORAL | Status: DC
  Administered 2024-04-14 – 2024-04-15 (×2): 325 mg via ORAL
  Filled 2024-04-13 (×2): qty 1

## 2024-04-13 MED ORDER — OXYCODONE HCL 5 MG PO TABS
10.0000 mg | ORAL_TABLET | ORAL | Status: DC | PRN
  Administered 2024-04-13 – 2024-04-15 (×12): 15 mg via ORAL
  Filled 2024-04-13 (×12): qty 3

## 2024-04-13 MED ORDER — OXYCODONE HCL 5 MG/5ML PO SOLN: 5.0000 mg | Freq: Once | ORAL | Status: AC | PRN

## 2024-04-13 MED ORDER — HYDROMORPHONE HCL 1 MG/ML IJ SOLN
0.2500 mg | INTRAMUSCULAR | Status: DC | PRN
  Administered 2024-04-13 (×2): 0.25 mg via INTRAVENOUS

## 2024-04-13 MED ORDER — ONDANSETRON HCL 4 MG PO TABS: 4.0000 mg | ORAL_TABLET | Freq: Four times a day (QID) | ORAL | Status: DC | PRN

## 2024-04-13 MED ORDER — FENTANYL CITRATE (PF) 50 MCG/ML IJ SOSY
50.0000 ug | PREFILLED_SYRINGE | INTRAMUSCULAR | Status: AC
  Administered 2024-04-13: 50 ug via INTRAVENOUS

## 2024-04-13 MED ORDER — HYDROMORPHONE HCL 1 MG/ML IJ SOLN
INTRAMUSCULAR | Status: AC
  Filled 2024-04-13: qty 1

## 2024-04-13 MED ORDER — PROPOFOL 10 MG/ML IV BOLUS
INTRAVENOUS | Status: DC | PRN
  Administered 2024-04-13: 200 mg via INTRAVENOUS

## 2024-04-13 MED ORDER — OXYCODONE HCL 5 MG PO TABS
5.0000 mg | ORAL_TABLET | Freq: Once | ORAL | Status: AC | PRN
  Administered 2024-04-13: 5 mg via ORAL

## 2024-04-13 MED ORDER — MIDAZOLAM HCL 2 MG/2ML IJ SOLN
INTRAMUSCULAR | Status: AC
  Filled 2024-04-13: qty 2

## 2024-04-13 MED ORDER — OXYCODONE HCL 5 MG PO TABS
ORAL_TABLET | ORAL | Status: AC
  Filled 2024-04-13: qty 1

## 2024-04-13 MED ORDER — BUPIVACAINE LIPOSOME 1.3 % IJ SUSP
INTRAMUSCULAR | Status: AC
  Filled 2024-04-13: qty 10

## 2024-04-13 MED ORDER — ACETAMINOPHEN 325 MG PO TABS: 325.0000 mg | ORAL_TABLET | Freq: Four times a day (QID) | ORAL | Status: DC | PRN

## 2024-04-13 MED ORDER — PROPOFOL 10 MG/ML IV BOLUS
INTRAVENOUS | Status: AC
  Filled 2024-04-13: qty 20

## 2024-04-13 MED ORDER — DULOXETINE HCL 30 MG PO CPEP
60.0000 mg | ORAL_CAPSULE | Freq: Every day | ORAL | Status: DC
  Administered 2024-04-13 – 2024-04-15 (×3): 60 mg via ORAL
  Filled 2024-04-13 (×3): qty 2

## 2024-04-13 MED ORDER — HYDROXYZINE HCL 10 MG PO TABS
10.0000 mg | ORAL_TABLET | Freq: Three times a day (TID) | ORAL | Status: DC | PRN
  Administered 2024-04-13: 10 mg via ORAL
  Filled 2024-04-13 (×2): qty 1

## 2024-04-13 MED ORDER — METHOCARBAMOL 500 MG PO TABS
500.0000 mg | ORAL_TABLET | Freq: Four times a day (QID) | ORAL | Status: DC | PRN
  Administered 2024-04-13 – 2024-04-15 (×3): 500 mg via ORAL
  Filled 2024-04-13 (×4): qty 1

## 2024-04-13 MED ORDER — CEFAZOLIN SODIUM-DEXTROSE 2-4 GM/100ML-% IV SOLN
INTRAVENOUS | Status: AC
  Filled 2024-04-13: qty 100

## 2024-04-13 MED ORDER — HYDROMORPHONE HCL 1 MG/ML IJ SOLN
0.2000 mg | INTRAMUSCULAR | Status: DC | PRN
  Administered 2024-04-13 – 2024-04-15 (×11): 0.4 mg via INTRAVENOUS
  Filled 2024-04-13 (×11): qty 0.5

## 2024-04-13 MED ORDER — BUPIVACAINE HCL (PF) 0.5 % IJ SOLN
INTRAMUSCULAR | Status: AC
  Filled 2024-04-13: qty 30

## 2024-04-13 MED ORDER — LACTATED RINGERS IV SOLN: INTRAVENOUS | Status: DC | PRN

## 2024-04-13 MED ORDER — DEXMEDETOMIDINE HCL IN NACL 80 MCG/20ML IV SOLN
INTRAVENOUS | Status: DC | PRN
  Administered 2024-04-13 (×2): 8 ug via INTRAVENOUS

## 2024-04-13 MED ORDER — FENTANYL CITRATE (PF) 100 MCG/2ML IJ SOLN
INTRAMUSCULAR | Status: DC | PRN
  Administered 2024-04-13 (×2): 50 ug via INTRAVENOUS

## 2024-04-13 MED ORDER — SUGAMMADEX SODIUM 200 MG/2ML IV SOLN
INTRAVENOUS | Status: DC | PRN
  Administered 2024-04-13: 171.4 mg via INTRAVENOUS

## 2024-04-13 MED ORDER — DIVALPROEX SODIUM ER 250 MG PO TB24
250.0000 mg | ORAL_TABLET | Freq: Every day | ORAL | Status: DC
  Administered 2024-04-13 – 2024-04-15 (×3): 250 mg via ORAL
  Filled 2024-04-13 (×3): qty 1

## 2024-04-13 MED ORDER — SENNOSIDES-DOCUSATE SODIUM 8.6-50 MG PO TABS
1.0000 | ORAL_TABLET | Freq: Every evening | ORAL | Status: DC | PRN
  Administered 2024-04-15: 1 via ORAL
  Filled 2024-04-13: qty 1

## 2024-04-13 MED ORDER — 0.9 % SODIUM CHLORIDE (POUR BTL) OPTIME
TOPICAL | Status: DC | PRN
  Administered 2024-04-13: 500 mL

## 2024-04-13 MED ORDER — OXYCODONE HCL 5 MG PO TABS: 5.0000 mg | ORAL_TABLET | ORAL | Status: DC | PRN

## 2024-04-13 MED ORDER — DEXAMETHASONE SOD PHOSPHATE PF 10 MG/ML IJ SOLN
INTRAMUSCULAR | Status: DC | PRN
  Administered 2024-04-13: 5 mg via INTRAVENOUS

## 2024-04-13 MED ORDER — ACETAMINOPHEN 10 MG/ML IV SOLN
INTRAVENOUS | Status: AC
  Filled 2024-04-13: qty 100

## 2024-04-13 MED ORDER — LIDOCAINE HCL (CARDIAC) PF 100 MG/5ML IV SOSY
PREFILLED_SYRINGE | INTRAVENOUS | Status: DC | PRN
  Administered 2024-04-13: 100 mg via INTRAVENOUS

## 2024-04-13 MED ORDER — HYDROMORPHONE HCL 1 MG/ML IJ SOLN
INTRAMUSCULAR | Status: DC | PRN
  Administered 2024-04-13 (×2): .5 mg via INTRAVENOUS

## 2024-04-13 MED ORDER — FENTANYL CITRATE (PF) 100 MCG/2ML IJ SOLN
INTRAMUSCULAR | Status: AC
  Filled 2024-04-13: qty 2

## 2024-04-13 MED ORDER — ONDANSETRON HCL 4 MG/2ML IJ SOLN: 4.0000 mg | Freq: Four times a day (QID) | INTRAMUSCULAR | Status: DC | PRN

## 2024-04-13 MED ORDER — ORAL CARE MOUTH RINSE: 15.0000 mL | OROMUCOSAL | Status: DC | PRN

## 2024-04-13 MED ORDER — KETAMINE HCL 50 MG/5ML IJ SOSY
PREFILLED_SYRINGE | INTRAMUSCULAR | Status: AC
  Filled 2024-04-13: qty 5

## 2024-04-13 MED ORDER — SODIUM CHLORIDE 0.9 % IV SOLN: INTRAVENOUS | Status: DC

## 2024-04-13 MED ORDER — CEFAZOLIN SODIUM-DEXTROSE 2-4 GM/100ML-% IV SOLN
2.0000 g | Freq: Four times a day (QID) | INTRAVENOUS | Status: AC
  Administered 2024-04-13 – 2024-04-14 (×3): 2 g via INTRAVENOUS
  Filled 2024-04-13 (×3): qty 100

## 2024-04-13 MED ORDER — SEVOFLURANE IN SOLN
RESPIRATORY_TRACT | Status: AC
  Filled 2024-04-13: qty 250

## 2024-04-13 MED ORDER — ONDANSETRON HCL 4 MG/2ML IJ SOLN: 4.0000 mg | Freq: Once | INTRAMUSCULAR | Status: DC | PRN

## 2024-04-13 MED ORDER — PHENYLEPHRINE 80 MCG/ML (10ML) SYRINGE FOR IV PUSH (FOR BLOOD PRESSURE SUPPORT)
PREFILLED_SYRINGE | INTRAVENOUS | Status: DC | PRN
  Administered 2024-04-13 (×5): 160 ug via INTRAVENOUS

## 2024-04-13 MED ORDER — DOCUSATE SODIUM 100 MG PO CAPS
100.0000 mg | ORAL_CAPSULE | Freq: Two times a day (BID) | ORAL | Status: DC
  Administered 2024-04-13 – 2024-04-15 (×5): 100 mg via ORAL
  Filled 2024-04-13 (×5): qty 1

## 2024-04-13 MED ORDER — KETAMINE HCL 50 MG/5ML IJ SOSY
PREFILLED_SYRINGE | INTRAMUSCULAR | Status: DC | PRN
  Administered 2024-04-13 (×2): 20 mg via INTRAVENOUS
  Administered 2024-04-13: 10 mg via INTRAVENOUS

## 2024-04-13 MED ORDER — ONDANSETRON HCL 4 MG/2ML IJ SOLN
INTRAMUSCULAR | Status: DC | PRN
  Administered 2024-04-13: 4 mg via INTRAVENOUS

## 2024-04-13 MED ORDER — METHOCARBAMOL 1000 MG/10ML IJ SOLN
500.0000 mg | Freq: Four times a day (QID) | INTRAMUSCULAR | Status: DC | PRN
  Administered 2024-04-13 – 2024-04-15 (×5): 500 mg via INTRAVENOUS
  Filled 2024-04-13 (×5): qty 10

## 2024-04-13 MED ORDER — DIPHENHYDRAMINE HCL 12.5 MG/5ML PO ELIX
12.5000 mg | ORAL_SOLUTION | ORAL | Status: DC | PRN
  Administered 2024-04-14: 25 mg via ORAL
  Administered 2024-04-14: 12.5 mg via ORAL
  Administered 2024-04-14 – 2024-04-15 (×2): 25 mg via ORAL
  Filled 2024-04-13 (×2): qty 10
  Filled 2024-04-13: qty 5
  Filled 2024-04-13: qty 10

## 2024-04-13 MED ORDER — BUPIVACAINE LIPOSOME 1.3 % IJ SUSP
INTRAMUSCULAR | Status: DC | PRN
  Administered 2024-04-13: 20 mL via INTRAMUSCULAR

## 2024-04-13 MED ORDER — ACETAMINOPHEN 10 MG/ML IV SOLN
1000.0000 mg | INTRAVENOUS | Status: AC
  Administered 2024-04-13: 1000 mg via INTRAVENOUS

## 2024-04-13 MED ORDER — MIDAZOLAM HCL (PF) 2 MG/2ML IJ SOLN
INTRAMUSCULAR | Status: DC | PRN
  Administered 2024-04-13: 2 mg via INTRAVENOUS

## 2024-04-13 MED ORDER — ROCURONIUM BROMIDE 100 MG/10ML IV SOLN
INTRAVENOUS | Status: DC | PRN
  Administered 2024-04-13: 60 mg via INTRAVENOUS
  Administered 2024-04-13: 20 mg via INTRAVENOUS

## 2024-04-13 MED ORDER — ACETAMINOPHEN 500 MG PO TABS
1000.0000 mg | ORAL_TABLET | Freq: Three times a day (TID) | ORAL | Status: DC
  Administered 2024-04-13 – 2024-04-15 (×6): 1000 mg via ORAL
  Filled 2024-04-13 (×6): qty 2

## 2024-04-13 NOTE — Anesthesia Postprocedure Evaluation (Signed)
"   Anesthesia Post Note  Patient: Daryl Chandler  Procedure(s) Performed: INSERTION, INTRAMEDULLARY ROD, TIBIA (Right: Leg Lower)  Patient location during evaluation: PACU Anesthesia Type: General Level of consciousness: awake and alert Pain management: pain level controlled Vital Signs Assessment: post-procedure vital signs reviewed and stable Respiratory status: spontaneous breathing, nonlabored ventilation, respiratory function stable and patient connected to nasal cannula oxygen Cardiovascular status: blood pressure returned to baseline and stable Postop Assessment: no apparent nausea or vomiting Anesthetic complications: no   No notable events documented.   Last Vitals:  Vitals:   04/13/24 1130 04/13/24 1145  BP: (!) 142/100 (!) 148/87  Pulse: (!) 127 (!) 105  Resp: (!) 26 20  Temp: 37.8 C 37.5 C  SpO2: 97% 97%    Last Pain:  Vitals:   04/13/24 1148  TempSrc:   PainSc: 5                  Daryl Chandler      "

## 2024-04-13 NOTE — Plan of Care (Signed)
   Problem: Health Behavior/Discharge Planning: Goal: Ability to manage health-related needs will improve Outcome: Progressing

## 2024-04-13 NOTE — Op Note (Signed)
 DATE OF SURGERY: 04/13/2024  PREOPERATIVE DIAGNOSIS:  1. Right tibia fracture 2. Right proximal fibula fracture  POSTOPERATIVE DIAGNOSIS:  1. Right tibia fracture 2. Right proximal fibula fracture  PROCEDURE: 1. Intramedullary nailing of Right tibia   SURGEON: Earnestine HILARIO Blanch, MD  ASSISTANTS: none  EBL: 50cc  COMPONENTS:  [employer] T2 Nail: 9 x ; 2 proximal interlocking screws; 2 distal interolocking screws.   INDICATIONS: Daryl Chandler is a 44 y.o. male who sustained a tibial fracture after a fall. X-rays in the ED showed a spiral midshaft tibia and proximal fibula fracture. Risks and benefits of intramedullary nailing of tibia were explained to the patient. Risks include but are not limited to bleeding, infection, injury to tissues, nerves, vessels, periprosthetic infection, dislocation, limb length discrepancy and risks of anesthesia. The patient understands these risks, has completed an informed consent, and wishes to proceed.   PROCEDURE:  The patient was brought into the operating room. After administering anesthesia, the patient was placed in the supine position on a radiolucent table. Anesthesia was administered. Preoperative IV antibiotics were administered. The leg was prepped with ChloraPrep solution before being draped sterilely in the standard fashion. A timeout was performed to verify the appropriate surgical site, patient, and procedure.  Of note, there was a large skin blister overlying the anterior tibia about the fracture site.  An approximately 5 cm incision was made proximal to the superior pole of the patella.  The quadriceps tendon was identified and a longitudinal incision was made in the midportion of the quadriceps tendon.  This allowed appropriate access to the patellofemoral joint.  A protective sleeve was inserted and our starting point was found just medial to the lateral tibial spine on the AP view and just off of the anterior articular cartilage on the  lateral view.  The guidepin was advanced appropriately.  An opening reamer was used to access the medullary canal of the tibia.  Next, the fracture site was identified.  Percutaneous stab incisions were made about the fracture site.  A reduction clamp was utilized to reduce the fracture, and appropriate alignment was confirmed on fluoroscopy on both AP and lateral views.  Next a ball-tipped guidewire was placed into the medullary canal, across the fracture site, and down to the physeal scar at the ankle.  A sizer was used to measure the length of the nail.  Next, we began sequential reaming and overreamed the nail diameter by 1.5 mm. The nail was advanced to an appropriate position and reduction was maintained. Two 5.72mm proximal interlocking screws were placed in the oblique positions in a standard fashion.  Next, two medial to lateral distal interlocking screws were placed using the perfect circle technique.  We then confirmed appropriate fracture reduction, bony alignment, and hardware position fluoroscopically in both the AP and lateral planes.    The wounds were irrigated thoroughly with sterile saline solution.  The quadriceps tendon was closed with 0 Vicryl.  The quadriceps incision and percutaneous incisions about the fracture site were closed with 2-0 Vicryl interrupted sutures in the dermis. The skin and stab holes for the interlocking screws were closed using staples.  Xeroform was applied to all incisions.  Sterile occlusive dressings were applied to all wounds.  A short leg plaster splint was applied.  The patient was then transferred to the recovery room in satisfactory condition after tolerating the procedure well.  POSTOPERATIVE PLAN: The patient will be NWB on the operative extremity.  Can remove splint and advance weightbearing in  2 weeks at time of outpatient follow-up. ASA 325 mg daily starting POD#1.  Perioperative antibiotics x 24 hours. PT/OT on POD#1.

## 2024-04-13 NOTE — Anesthesia Procedure Notes (Signed)
 Procedure Name: Intubation Date/Time: 04/13/2024 8:10 AM  Performed by: Tod Handing, CRNAPre-anesthesia Checklist: Patient identified, Patient being monitored, Timeout performed, Emergency Drugs available and Suction available Patient Re-evaluated:Patient Re-evaluated prior to induction Oxygen Delivery Method: Circle system utilized Preoxygenation: Pre-oxygenation with 100% oxygen Induction Type: IV induction Ventilation: Mask ventilation without difficulty Laryngoscope Size: Mac, McGrath and 4 Grade View: Grade I Tube type: Oral Tube size: 7.0 mm Number of attempts: 1 Airway Equipment and Method: Stylet Placement Confirmation: ETT inserted through vocal cords under direct vision, positive ETCO2 and breath sounds checked- equal and bilateral Secured at: 22 cm Tube secured with: Tape Dental Injury: Teeth and Oropharynx as per pre-operative assessment

## 2024-04-13 NOTE — Addendum Note (Signed)
 Addendum  created 04/13/24 1201 by Tod Handing, CRNA   Flowsheet accepted, Intraprocedure Flowsheets edited

## 2024-04-13 NOTE — Transfer of Care (Signed)
\  Immediate Anesthesia Transfer of Care Note  Patient: Daryl Chandler  Procedure(s) Performed: Procedures: INSERTION, INTRAMEDULLARY ROD, TIBIA (Right)  Patient Location: PACU  Anesthesia Type:General  Level of Consciousness: sedated  Airway & Oxygen Therapy: Patient Spontanous Breathing and Patient connected to face mask oxygen  Post-op Assessment: Report given to RN and Post -op Vital signs reviewed and stable  Post vital signs: Reviewed and stable  Last Vitals:  Vitals:   04/13/24 0708 04/13/24 1036  BP: 135/81 (!) 150/79  Pulse: 91 (!) 104  Resp: (!) 22 10  Temp: 37.4 C   SpO2: 96% 99%    Complications: No apparent anesthesia complications
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
## 2024-04-13 NOTE — Plan of Care (Signed)

## 2024-04-14 MED ORDER — ONDANSETRON HCL 4 MG PO TABS: 4.0000 mg | ORAL_TABLET | Freq: Four times a day (QID) | ORAL | 0 refills | Status: DC | PRN

## 2024-04-14 MED ORDER — ASPIRIN 325 MG PO TBEC: 325.0000 mg | DELAYED_RELEASE_TABLET | Freq: Every day | ORAL | Status: AC

## 2024-04-14 MED ORDER — METHOCARBAMOL 500 MG PO TABS: 500.0000 mg | ORAL_TABLET | Freq: Four times a day (QID) | ORAL | 0 refills | Status: DC | PRN

## 2024-04-14 MED ORDER — OXYCODONE HCL 5 MG PO TABS: 5.0000 mg | ORAL_TABLET | ORAL | 0 refills | Status: DC | PRN

## 2024-04-14 NOTE — Plan of Care (Signed)
   Problem: Clinical Measurements: Goal: Ability to maintain clinical measurements within normal limits will improve Outcome: Progressing
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
## 2024-04-15 ENCOUNTER — Other Ambulatory Visit: Payer: Self-pay

## 2024-04-15 ENCOUNTER — Encounter: Payer: Self-pay | Admitting: Orthopedic Surgery

## 2024-04-15 MED ORDER — METHOCARBAMOL 500 MG PO TABS
500.0000 mg | ORAL_TABLET | Freq: Four times a day (QID) | ORAL | 0 refills | Status: AC | PRN
  Filled 2024-04-15: qty 20, 5d supply, fill #0

## 2024-04-15 MED ORDER — ONDANSETRON HCL 4 MG PO TABS
4.0000 mg | ORAL_TABLET | Freq: Four times a day (QID) | ORAL | 0 refills | Status: AC | PRN
  Filled 2024-04-15: qty 20, 5d supply, fill #0

## 2024-04-15 MED ORDER — OXYCODONE HCL 5 MG PO TABS
5.0000 mg | ORAL_TABLET | ORAL | 0 refills | Status: AC | PRN
  Filled 2024-04-15: qty 30, 3d supply, fill #0

## 2024-04-15 NOTE — Progress Notes (Signed)
 Patient discharged to home wheeled out of unit by [place], NT and accompanied by wife with all belongings including medications delivered by pharmacy. Medications and discharge instructions reviewed. All questions answered. PIV x 1 removed, no bleeding and intact. Patient and family verbalized understanding of signs and symptoms of infection. Patient and wife agreed to follow up with post-surgery appointment in two weeks as indicated on AVS. Patient and family satisfied with overall care at [facility].

## 2024-04-15 NOTE — Progress Notes (Signed)
 Physical Therapy Treatment Patient Details Name: Daryl Chandler MRN: [id number] DOB: [birth year] Today's Date: 04/15/2024   History of Present Illness Daryl Chandler is a 44 y.o. male who sustained a tibial fracture after a fall. X-rays in the ED showed a spiral midshaft tibia and proximal fibula fracture. s/p Intramedullary nailing of Right tibia.    PT Comments  Pt ready for session.  Bed mobility independent.  He is able to transition to crutches and walk to/from PT gym with supervision.  Stairs completed.  Has 2 rails at home for back deck which he stated he feels he can safely access.  No rails at front but he feels better with back steps.  He has no further questions or concerns at this time and awaiting DC home. Crutches have been given and adjusted for height.  Original recommendations for HHPT.  Adjusted to OPPT if he opts to do so.    If plan is discharge home, recommend the following: A little help with walking and/or transfers;A little help with bathing/dressing/bathroom   Can travel by private vehicle        Equipment Recommendations  Crutches;Other (comment)    Recommendations for Other Services       Precautions / Restrictions Precautions Precautions: Fall Restrictions Weight Bearing Restrictions Per Provider Order: Yes RLE Weight Bearing Per Provider Order: Non weight bearing     Mobility  Bed Mobility Overal bed mobility: Modified Independent               Patient Response: Cooperative  Transfers Overall transfer level: Modified independent                      Ambulation/Gait Ambulation/Gait assistance: Supervision, Contact guard assist Gait Distance (Feet): 200 Feet Assistive device: Crutches Gait Pattern/deviations: Step-through pattern           Stairs Stairs: Yes Stairs assistance: Contact guard assist Stair Management: Two rails, Forwards Number of Stairs: 4 General stair comments: with ease   Wheelchair Mobility      Tilt Bed Tilt Bed Patient Response: Cooperative  Modified Rankin (Stroke Patients Only)       Balance Overall balance assessment: Needs assistance Sitting-balance support: Feet unsupported Sitting balance-Leahy Scale: Normal     Standing balance support: During functional activity, Reliant on assistive device for balance Standing balance-Leahy Scale: Good                              Communication Communication Communication: No apparent difficulties  Cognition Arousal: Alert Behavior During Therapy: WFL for tasks assessed/performed   PT - Cognitive impairments: No apparent impairments                         Following commands: Intact      Cueing Cueing Techniques: Verbal cues  Exercises      General Comments        Pertinent Vitals/Pain Pain Assessment Pain Assessment: Faces Faces Pain Scale: Hurts even more Pain Location: R tibia Pain Descriptors / Indicators: Throbbing Pain Intervention(s): Limited activity within patient's tolerance, Monitored during session, Premedicated before session, Repositioned    Home Living                          Prior Function            PT Goals (current goals can now be found in the  care plan section) Progress towards PT goals: Progressing toward goals    Frequency    Min 2X/week      PT Plan      Co-evaluation              AM-PAC PT 6 Clicks Mobility   Outcome Measure  Help needed turning from your back to your side while in a flat bed without using bedrails?: None Help needed moving from lying on your back to sitting on the side of a flat bed without using bedrails?: None Help needed moving to and from a bed to a chair (including a wheelchair)?: None Help needed standing up from a chair using your arms (e.g., wheelchair or bedside chair)?: None Help needed to walk in hospital room?: A Little Help needed climbing 3-5 steps with a railing? : A Little 6 Click  Score: 22    End of Session Equipment Utilized During Treatment: Gait belt Activity Tolerance: Patient tolerated treatment well Patient left: in bed;with call bell/phone within reach;with bed alarm set Nurse Communication: Mobility status PT Visit Diagnosis: Other abnormalities of gait and mobility (R26.89);Difficulty in walking, not elsewhere classified (R26.2)     Time: 9054-8992 PT Time Calculation (min) (ACUTE ONLY): 22 min  Charges:    $Gait Training: 8-22 mins PT General Charges $$ ACUTE PT VISIT: 1 Visit                   Lauraine Gills, PTA 04/15/24, 10:16 AM

## 2024-04-15 NOTE — Progress Notes (Signed)
" °  Subjective: 2 Days Post-Op Procedures (LRB): INSERTION, INTRAMEDULLARY ROD, TIBIA (Right) Patient reports pain as moderate.   Patient is well, and has had no acute complaints or problems Plan is to go Home after hospital stay. Negative for chest pain and shortness of breath Fever: no Gastrointestinal: Negative for nausea and vomiting  Objective: Vital signs in last 24 hours: Temp:  [98.2 F (36.8 C)-98.5 F (36.9 C)] 98.2 F (36.8 C) (02/02 0238) Pulse Rate:  [91-108] 91 (02/02 0238) Resp:  [18-19] 18 (02/02 0238) BP: (120-152)/(77-88) 135/88 (02/02 0238) SpO2:  [96 %-99 %] 96 % (02/02 0238)  Intake/Output from previous day:  Intake/Output Summary (Last 24 hours) at 04/15/2024 0834 Last data filed at 04/15/2024 0800 Gross per 24 hour  Intake 1769.69 ml  Output 700 ml  Net 1069.69 ml    Intake/Output this shift: Total I/O In: 749.7 [I.V.:749.7] Out: -   Labs: Recent Labs    04/12/24 1823  HGB 14.2   Recent Labs    04/12/24 1823  WBC 11.2*  RBC 4.99  HCT 42.1  PLT 244   Recent Labs    04/12/24 1823  NA 138  K 4.0  CL 100  CO2 24  BUN 14  CREATININE 0.90  GLUCOSE 107*  CALCIUM 9.0   Recent Labs    04/12/24 1845  INR 1.0     EXAM General - Patient is Alert and Oriented Extremity - Neurovascular intact Sensation intact distally Dorsiflexion/Plantar flexion intact Dressing/Incision - clean, dry, no drainage, with a cast in place Motor Function - intact, moving toes well on exam.  Ambulated 40 feet with physical therapy.  Some difficulty with transfers.  Past Medical History:  Diagnosis Date   Anxiety    Depression     Assessment/Plan: 2 Days Post-Op Procedures (LRB): INSERTION, INTRAMEDULLARY ROD, TIBIA (Right) Principal Problem:   Tibia fracture  Estimated body mass index is 26.36 kg/m as calculated from the following:   Height as of this encounter: 5' 11 (1.803 m).   Weight as of this encounter: 85.7 kg. Advance diet Up with  therapy  Discharge planning: Plan to discharge the patient home.  Minimal ambulation with physical therapy.  Still working on pain management.    DVT Prophylaxis - Aspirin  Non-Weight-Bearing right leg  Krystal Doyne, PA-C Orthopaedic Surgery 04/15/2024, 8:34 AM  "

## 2024-04-17 ENCOUNTER — Encounter: Payer: Self-pay | Admitting: Orthopedic Surgery

## 2024-05-23 ENCOUNTER — Telehealth (HOSPITAL_COMMUNITY): Admitting: Psychiatry
